# Patient Record
Sex: Male | Born: 1965 | Race: White | Hispanic: No | Marital: Married | Smoking: Former smoker
Health system: Southern US, Community
[De-identification: ages and names within clinical notes are randomized; demographics above are authoritative.]

## PROBLEM LIST (undated history)

## (undated) HISTORY — PX: HERNIA REPAIR: SHX51

## (undated) HISTORY — PX: KNEE SURGERY: SHX244

## (undated) HISTORY — PX: BACK SURGERY: SHX140

---

## 1999-07-23 ENCOUNTER — Encounter: Payer: Self-pay | Admitting: Neurosurgery

## 1999-07-23 ENCOUNTER — Ambulatory Visit (HOSPITAL_COMMUNITY): Admission: RE | Admit: 1999-07-23 | Discharge: 1999-07-23 | Payer: Self-pay | Admitting: Neurosurgery

## 1999-07-25 ENCOUNTER — Inpatient Hospital Stay (HOSPITAL_COMMUNITY): Admission: RE | Admit: 1999-07-25 | Discharge: 1999-07-26 | Payer: Self-pay | Admitting: Neurosurgery

## 1999-07-25 ENCOUNTER — Encounter: Payer: Self-pay | Admitting: Neurosurgery

## 2004-09-13 ENCOUNTER — Ambulatory Visit: Payer: Self-pay | Admitting: Internal Medicine

## 2004-09-27 ENCOUNTER — Ambulatory Visit: Payer: Self-pay | Admitting: Internal Medicine

## 2005-02-07 ENCOUNTER — Observation Stay: Payer: Self-pay | Admitting: Internal Medicine

## 2005-02-07 ENCOUNTER — Other Ambulatory Visit: Payer: Self-pay

## 2005-11-20 ENCOUNTER — Emergency Department: Payer: Self-pay | Admitting: Emergency Medicine

## 2005-11-20 ENCOUNTER — Other Ambulatory Visit: Payer: Self-pay

## 2006-08-01 ENCOUNTER — Emergency Department: Payer: Self-pay | Admitting: Emergency Medicine

## 2006-11-30 ENCOUNTER — Ambulatory Visit: Payer: Self-pay | Admitting: Internal Medicine

## 2008-07-06 ENCOUNTER — Ambulatory Visit: Payer: Self-pay | Admitting: Internal Medicine

## 2011-02-09 ENCOUNTER — Ambulatory Visit: Payer: Self-pay | Admitting: Family Medicine

## 2011-05-10 ENCOUNTER — Ambulatory Visit: Payer: Self-pay | Admitting: Internal Medicine

## 2014-04-22 ENCOUNTER — Emergency Department: Payer: Self-pay | Admitting: Emergency Medicine

## 2014-04-22 LAB — URINALYSIS, COMPLETE
Bacteria: NONE SEEN
Bilirubin,UR: NEGATIVE
Glucose,UR: NEGATIVE mg/dL (ref 0–75)
Ketone: NEGATIVE
Leukocyte Esterase: NEGATIVE
Nitrite: NEGATIVE
Ph: 5 (ref 4.5–8.0)
Protein: 30
RBC,UR: 1880 /HPF (ref 0–5)
Specific Gravity: 1.028 (ref 1.003–1.030)
Squamous Epithelial: NONE SEEN
WBC UR: 7 /HPF (ref 0–5)

## 2014-04-22 LAB — COMPREHENSIVE METABOLIC PANEL
Albumin: 4.1 g/dL (ref 3.4–5.0)
Alkaline Phosphatase: 100 U/L
Anion Gap: 7 (ref 7–16)
BUN: 23 mg/dL — ABNORMAL HIGH (ref 7–18)
Bilirubin,Total: 0.7 mg/dL (ref 0.2–1.0)
Calcium, Total: 9.3 mg/dL (ref 8.5–10.1)
Chloride: 104 mmol/L (ref 98–107)
Co2: 28 mmol/L (ref 21–32)
Creatinine: 1.29 mg/dL (ref 0.60–1.30)
EGFR (African American): >60
EGFR (Non-African Amer.): >60
Glucose: 98 mg/dL (ref 65–99)
Osmolality: 281 (ref 275–301)
Potassium: 3.8 mmol/L (ref 3.5–5.1)
SGOT(AST): 26 U/L (ref 15–37)
SGPT (ALT): 32 U/L (ref 12–78)
Sodium: 139 mmol/L (ref 136–145)
Total Protein: 7.8 g/dL (ref 6.4–8.2)

## 2014-04-22 LAB — CBC
HCT: 52.8 % — ABNORMAL HIGH (ref 40.0–52.0)
HGB: 17.6 g/dL (ref 13.0–18.0)
MCH: 32.4 pg (ref 26.0–34.0)
MCHC: 33.4 g/dL (ref 32.0–36.0)
MCV: 97 fL (ref 80–100)
Platelet: 146 10*3/uL — ABNORMAL LOW (ref 150–440)
RBC: 5.44 10*6/uL (ref 4.40–5.90)
RDW: 12.5 % (ref 11.5–14.5)
WBC: 11.9 10*3/uL — ABNORMAL HIGH (ref 3.8–10.6)

## 2014-05-11 DIAGNOSIS — N200 Calculus of kidney: Secondary | ICD-10-CM | POA: Insufficient documentation

## 2015-02-11 DIAGNOSIS — M5136 Other intervertebral disc degeneration, lumbar region: Secondary | ICD-10-CM | POA: Insufficient documentation

## 2015-06-27 DIAGNOSIS — Z87442 Personal history of urinary calculi: Secondary | ICD-10-CM | POA: Insufficient documentation

## 2017-03-02 ENCOUNTER — Ambulatory Visit
Admission: EM | Admit: 2017-03-02 | Discharge: 2017-03-02 | Disposition: A | Discharge status: Home / Self Care | Payer: BLUE CROSS/BLUE SHIELD | Attending: Internal Medicine | Admitting: Internal Medicine

## 2017-03-02 DIAGNOSIS — J069 Acute upper respiratory infection, unspecified: Secondary | ICD-10-CM

## 2017-03-02 DIAGNOSIS — J029 Acute pharyngitis, unspecified: Secondary | ICD-10-CM

## 2017-03-02 LAB — RAPID STREP SCREEN (MED CTR MEBANE ONLY): STREPTOCOCCUS, GROUP A SCREEN (DIRECT): NEGATIVE

## 2017-03-02 MED ORDER — AMOXICILLIN-POT CLAVULANATE 875-125 MG PO TABS: 1.0000 | ORAL_TABLET | Freq: Two times a day (BID) | ORAL | 0 refills | Status: DC

## 2017-03-02 NOTE — ED Triage Notes (Signed)
Patient complains of sore throat, painful swallowing, redness and swelling with possible low grade fever. Symptoms started 1 week ago and have been worsening.

## 2017-03-02 NOTE — ED Provider Notes (Signed)
CSN: [id number]     Arrival date & time 03/02/17  1814 History   First MD Initiated Contact with Patient 03/02/17 1838     Chief Complaint  Patient presents with  . Sore Throat   (Consider location/radiation/quality/duration/timing/severity/associated sxs/prior Treatment) HPI  This a 51 year old male who presents with a one-week history of sore throat painful swallowing fever cough and congestion with a runny nose. He states that it is worsening over time. He does not smoke but used to smoke. He is afebrile today. O2 sats on room air 98%.      History reviewed. No pertinent past medical history. Past Surgical History:  Procedure Laterality Date  . BACK SURGERY    . HERNIA REPAIR    . KNEE SURGERY Right    Family History  Problem Relation Age of Onset  . CAD Mother   . Kidney Stones Father    Social History  Substance Use Topics  . Smoking status: Former Research scientist (life sciences)  . Smokeless tobacco: Never Used  . Alcohol use Yes     Comment: occasionally    Review of Systems  Constitutional: Positive for activity change. Negative for chills, fatigue and fever.  HENT: Positive for congestion, postnasal drip, rhinorrhea, sore throat and trouble swallowing.   Respiratory: Positive for cough. Negative for shortness of breath, wheezing and stridor.   All other systems reviewed and are negative.   Allergies  Patient has no known allergies.  Home Medications   Prior to Admission medications   Medication Sig Start Date End Date Taking? Authorizing Provider  amoxicillin-clavulanate (AUGMENTIN) 875-125 MG tablet Take 1 tablet by mouth every 12 (twelve) hours. 03/02/17   Lorin Picket, PA-C   Meds Ordered and Administered this Visit  Medications - No data to display  BP (!) 149/97 (BP Location: Left Arm)   Pulse 96   Temp 97.9 F (36.6 C) (Oral)   Resp 18   Ht 5\' 11"  (1.803 m)   Wt 225 lb (102.1 kg)   SpO2 98%   BMI 31.38 kg/m  No data found.   Physical Exam  Constitutional:  He is oriented to person, place, and time. He appears well-developed and well-nourished. No distress.  HENT:  Head: Normocephalic.  Right Ear: External ear normal.  Left Ear: External ear normal.  Nose: Nose normal.  Mouth/Throat: No oropharyngeal exudate.  Pharynx is erythematous but without exudate.  Eyes: Pupils are equal, round, and reactive to light. Right eye exhibits no discharge. Left eye exhibits no discharge.  Neck: Normal range of motion.  Pulmonary/Chest: Effort normal and breath sounds normal. No respiratory distress. He has no wheezes. He has no rales.  Musculoskeletal: Normal range of motion.  Lymphadenopathy:    He has no cervical adenopathy.  Neurological: He is alert and oriented to person, place, and time.  Skin: Skin is warm and dry. He is not diaphoretic.  Psychiatric: He has a normal mood and affect. His behavior is normal. Judgment and thought content normal.  Nursing note and vitals reviewed.   Urgent Care Course     Procedures (including critical care time)  Labs Review Labs Reviewed  RAPID STREP SCREEN (NOT AT [facility])  CULTURE, GROUP A STREP [facility])    Imaging Review No results found.   Visual Acuity Review  Right Eye Distance:   Left Eye Distance:   Bilateral Distance:    Right Eye Near:   Left Eye Near:    Bilateral Near:  MDM   1. Pharyngitis, unspecified etiology   2. Upper respiratory tract infection, unspecified type    Discharge Medication List as of 03/02/2017  6:59 PM    START taking these medications   Details  amoxicillin-clavulanate (AUGMENTIN) 875-125 MG tablet Take 1 tablet by mouth every 12 (twelve) hours., Starting Fri 03/02/2017, Normal      Plan: 1. Test/x-ray results and diagnosis reviewed with patient 2. rx as per orders; risks, benefits, potential side effects reviewed with patient 3. Recommend supportive treatment with fluids and rest. He'll use saltwater gargles and lozenges as necessary for comfort of  his throat. We'll start the patient on empiric Augmentin since he's had this for week has not improved and his throat is rather sore despite the rapid test. The culture sensitivities will be available in 48 hours. 4. F/u prn if symptoms worsen or don't improve     Lorin Picket, PA-C 03/02/17 1916

## 2017-03-04 LAB — CULTURE, GROUP A STREP (THRC)

## 2017-11-03 ENCOUNTER — Emergency Department: Payer: BLUE CROSS/BLUE SHIELD

## 2017-11-03 ENCOUNTER — Encounter: Payer: Self-pay | Admitting: Intensive Care

## 2017-11-03 ENCOUNTER — Emergency Department
Admission: EM | Admit: 2017-11-03 | Discharge: 2017-11-03 | Disposition: A | Discharge status: Home / Self Care | Payer: BLUE CROSS/BLUE SHIELD | Attending: Student in an Organized Health Care Education/Training Program | Admitting: Student in an Organized Health Care Education/Training Program

## 2017-11-03 DIAGNOSIS — R1013 Epigastric pain: Secondary | ICD-10-CM | POA: Diagnosis present

## 2017-11-03 DIAGNOSIS — F17210 Nicotine dependence, cigarettes, uncomplicated: Secondary | ICD-10-CM | POA: Diagnosis not present

## 2017-11-03 LAB — TROPONIN I: Troponin I: <0.03 ng/mL (ref <0.03)

## 2017-11-03 LAB — COMPREHENSIVE METABOLIC PANEL
ALBUMIN: 4.5 g/dL (ref 3.5–5.0)
ALT: 27 U/L (ref 17–63)
ANION GAP: 9 (ref 5–15)
AST: 29 U/L (ref 15–41)
Alkaline Phosphatase: 97 U/L (ref 38–126)
BILIRUBIN TOTAL: 1.1 mg/dL (ref 0.3–1.2)
BUN: 17 mg/dL (ref 6–20)
CALCIUM: 9.3 mg/dL (ref 8.9–10.3)
CO2: 24 mmol/L (ref 22–32)
Chloride: 106 mmol/L (ref 101–111)
Creatinine, Ser: 0.99 mg/dL (ref 0.61–1.24)
GFR calc Af Amer: >60 mL/min (ref >60)
GFR calc non Af Amer: >60 mL/min (ref >60)
GLUCOSE: 110 mg/dL — AB (ref 65–99)
Potassium: 4 mmol/L (ref 3.5–5.1)
Sodium: 139 mmol/L (ref 135–145)
TOTAL PROTEIN: 7.9 g/dL (ref 6.5–8.1)

## 2017-11-03 LAB — CBC WITH DIFFERENTIAL/PLATELET
BASOS PCT: 0 %
Basophils Absolute: 0 10*3/uL (ref 0–0.1)
Eosinophils Absolute: 0.3 10*3/uL (ref 0–0.7)
Eosinophils Relative: 4 %
HEMATOCRIT: 51.8 % (ref 40.0–52.0)
HEMOGLOBIN: 17.9 g/dL (ref 13.0–18.0)
Lymphocytes Relative: 23 %
Lymphs Abs: 1.6 10*3/uL (ref 1.0–3.6)
MCH: 32.4 pg (ref 26.0–34.0)
MCHC: 34.6 g/dL (ref 32.0–36.0)
MCV: 93.8 fL (ref 80.0–100.0)
MONOS PCT: 7 %
Monocytes Absolute: 0.5 10*3/uL (ref 0.2–1.0)
NEUTROS ABS: 4.7 10*3/uL (ref 1.4–6.5)
NEUTROS PCT: 66 %
Platelets: 140 10*3/uL — ABNORMAL LOW (ref 150–440)
RBC: 5.52 MIL/uL (ref 4.40–5.90)
RDW: 12.5 % (ref 11.5–14.5)
WBC: 7.1 10*3/uL (ref 3.8–10.6)

## 2017-11-03 LAB — LIPASE, BLOOD: Lipase: 42 U/L (ref 11–51)

## 2017-11-03 MED ORDER — GI COCKTAIL ~~LOC~~
30.0000 mL | Freq: Once | ORAL | Status: AC
  Administered 2017-11-03: 30 mL via ORAL
  Filled 2017-11-03: qty 30

## 2017-11-03 MED ORDER — RANITIDINE HCL 150 MG PO TABS: 150.0000 mg | ORAL_TABLET | Freq: Two times a day (BID) | ORAL | 1 refills | Status: DC

## 2017-11-03 NOTE — ED Provider Notes (Signed)
[facility] Emergency Department Provider Note    First MD Initiated Contact with Patient 11/03/17 [phone]     (approximate)  I have reviewed the triage vital signs and the nursing notes.   HISTORY  Chief Complaint Abdominal Pain    HPI Alan Santos is a 52 y.o. male presents with chief complaint of several weeks of midepigastric burning discomfort unchanged by eating and no fevers or nausea.  He is moving his bowels.  States that he is concerned is related to his gallbladder is his wife just had her gallbladder removed and he thinks his similar symptoms that she was having.  Denies any chest pain or shortness of breath.  Does have a history of reflux.  He does smoke and drink alcohol.  No pain radiating through to his back.  Came to the ER today because the pain and not improved over the past several weeks and wanted to figure out what was going on.  History reviewed. No pertinent past medical history. Family History  Problem Relation Age of Onset  . CAD Mother   . Kidney Stones Father    Past Surgical History:  Procedure Laterality Date  . BACK SURGERY    . HERNIA REPAIR    . KNEE SURGERY Right    There are no active problems to display for this patient.     Prior to Admission medications   Medication Sig Start Date End Date Taking? Authorizing Provider  amoxicillin-clavulanate (AUGMENTIN) 875-125 MG tablet Take 1 tablet by mouth every 12 (twelve) hours. 03/02/17   Lorin Picket, PA-C  ranitidine (ZANTAC) 150 MG tablet Take 1 tablet (150 mg total) by mouth 2 (two) times daily. 11/03/17 11/03/18  Merlyn Lot, MD    Allergies Patient has no known allergies.    Social History Social History   Tobacco Use  . Smoking status: Current Every Day Smoker    Types: Cigarettes  . Smokeless tobacco: Never Used  Substance Use Topics  . Alcohol use: Yes    Comment: occasionally  . Drug use: No    Review of Systems Patient denies  headaches, rhinorrhea, blurry vision, numbness, shortness of breath, chest pain, edema, cough, abdominal pain, nausea, vomiting, diarrhea, dysuria, fevers, rashes or hallucinations unless otherwise stated above in HPI. ____________________________________________   PHYSICAL EXAM:  VITAL SIGNS: Vitals:   11/03/17 0758 11/03/17 0817  BP: (!) 184/105 (!) 128/98  Pulse: (!) 107   Resp: 18   Temp: 98 F (36.7 C) 98.6 F (37 C)  SpO2: 97%     Constitutional: Alert and oriented. Well appearing and in no acute distress. Eyes: Conjunctivae are normal.  Head: Atraumatic. Nose: No congestion/rhinnorhea. Mouth/Throat: Mucous membranes are moist.   Neck: No stridor. Painless ROM.  Cardiovascular: Normal rate, regular rhythm. Grossly normal heart sounds.  Good peripheral circulation. Respiratory: Normal respiratory effort.  No retractions. Lungs CTAB. Gastrointestinal: Soft and nontender. No distention. No abdominal bruits. No CVA tenderness. Genitourinary:  Musculoskeletal: No lower extremity tenderness nor edema.  No joint effusions. Neurologic:  Normal speech and language. No gross focal neurologic deficits are appreciated. No facial droop Skin:  Skin is warm, dry and intact. No rash noted. Psychiatric: Mood and affect are normal. Speech and behavior are normal.  ____________________________________________   LABS (all labs ordered are listed, but only abnormal results are displayed)  Results for orders placed or performed during the hospital encounter of 11/03/17 (from the past 24 hour(s))  CBC with Differential/Platelet  Status: Abnormal   Collection Time: 11/03/17  8:06 AM  Result Value Ref Range   WBC 7.1 3.8 - 10.6 K/uL   RBC 5.52 4.40 - 5.90 MIL/uL   Hemoglobin 17.9 13.0 - 18.0 g/dL   HCT 51.8 40.0 - 52.0 %   MCV 93.8 80.0 - 100.0 fL   MCH 32.4 26.0 - 34.0 pg   MCHC 34.6 32.0 - 36.0 g/dL   RDW 12.5 11.5 - 14.5 %   Platelets 140 (L) 150 - 440 K/uL   Neutrophils  Relative % 66 %   Neutro Abs 4.7 1.4 - 6.5 K/uL   Lymphocytes Relative 23 %   Lymphs Abs 1.6 1.0 - 3.6 K/uL   Monocytes Relative 7 %   Monocytes Absolute 0.5 0.2 - 1.0 K/uL   Eosinophils Relative 4 %   Eosinophils Absolute 0.3 0 - 0.7 K/uL   Basophils Relative 0 %   Basophils Absolute 0.0 0 - 0.1 K/uL  Comprehensive metabolic panel     Status: Abnormal   Collection Time: 11/03/17  8:06 AM  Result Value Ref Range   Sodium 139 135 - 145 mmol/L   Potassium 4.0 3.5 - 5.1 mmol/L   Chloride 106 101 - 111 mmol/L   CO2 24 22 - 32 mmol/L   Glucose, Bld 110 (H) 65 - 99 mg/dL   BUN 17 6 - 20 mg/dL   Creatinine, Ser 0.99 0.61 - 1.24 mg/dL   Calcium 9.3 8.9 - 10.3 mg/dL   Total Protein 7.9 6.5 - 8.1 g/dL   Albumin 4.5 3.5 - 5.0 g/dL   AST 29 15 - 41 U/L   ALT 27 17 - 63 U/L   Alkaline Phosphatase 97 38 - 126 U/L   Total Bilirubin 1.1 0.3 - 1.2 mg/dL   GFR calc non Af Amer >60 >60 mL/min   GFR calc Af Amer >60 >60 mL/min   Anion gap 9 5 - 15  Lipase, blood     Status: None   Collection Time: 11/03/17  8:06 AM  Result Value Ref Range   Lipase 42 11 - 51 U/L  Troponin I     Status: None   Collection Time: 11/03/17  8:06 AM  Result Value Ref Range   Troponin I <0.03 <0.03 ng/mL   ____________________________________________  EKG My review and personal interpretation at Time: 8:02   Indication: abdominal pain  Rate: 100  Rhythm: sinus Axis: normal Other: nonspecific st changes, no stemi ____________________________________________  RADIOLOGY   ____________________________________________   PROCEDURES  Procedure(s) performed:  Procedures    Critical Care performed: no ____________________________________________   INITIAL IMPRESSION / ASSESSMENT AND PLAN / ED COURSE  Pertinent labs & imaging results that were available during my care of the patient were reviewed by me and considered in my medical decision making (see chart for details).  DDX: Gastritis, gastric  outlet obstruction, esophagitis, cholecystitis, cholelithiasis, biliary colic, IBS, IBD, ACS, SBO  Cashawn Yanko Santos is a 52 y.o. who presents to the ED with several weeks of epigastric discomfort as described above.  Patient is well-appearing and in no acute distress.  Vital signs are stable.  Blood work sent for the above differential is reassuring.  This not clinically consistent with acute cholelithiasis or cholecystitis.  There is no evidence of biliary elevation.  No evidence of acute appendicitis.  This does seem most consistent with gastritis possible esophagitis given his smoking history and alcohol history.  No evidence of pancreatitis.  At this point will  give patient prescription for antacid medication and referral to outpatient GI.  Have discussed with the patient and available family all diagnostics and treatments performed thus far and all questions were answered to the best of my ability. The patient demonstrates understanding and agreement with plan.       ____________________________________________   FINAL CLINICAL IMPRESSION(S) / ED DIAGNOSES  Final diagnoses:  Epigastric pain      NEW MEDICATIONS STARTED DURING THIS VISIT:  New Prescriptions   RANITIDINE (ZANTAC) 150 MG TABLET    Take 1 tablet (150 mg total) by mouth 2 (two) times daily.     Note:  This document was prepared using Dragon voice recognition software and may include unintentional dictation errors.    Merlyn Lot, MD 11/03/17 [phone]

## 2017-11-03 NOTE — Discharge Instructions (Signed)

## 2017-11-03 NOTE — ED Triage Notes (Signed)
Patient c/o epigastric constant pain for weeks. C/o nausea and diarrhea. No distress during ambulation in triage

## 2017-11-03 NOTE — Progress Notes (Signed)
C/o Epigastric pain on and off for two weeks. Dull non radiating associated with occasional diarrhea (non bloody) , no nausea, no vomiting. Denies fevers, change in diet, SOB or cough. No know relieving or aggravating factors . Smokes cigarettes, occasional alcohol drinker . No previous history of the same .  DDX.  - Gastritis - Gastric ulcer - Pancreatitis - GERD

## 2017-11-05 ENCOUNTER — Telehealth: Payer: Self-pay | Admitting: Gastroenterology

## 2017-11-05 NOTE — Telephone Encounter (Signed)
PATIENT CALLED & L/M ON THE ANSWERING MACHINE STATING HE WAS SEEN IN THE ER & NEEDED TO SCHEDULE AN APPOINTMENT.I CALLED HIM & L/M TO RETURN CALL TO SCHEDULE APPOINTMENT WITH DR Marius Ditch F/U FROM ER(PER ER)

## 2017-11-05 NOTE — Telephone Encounter (Signed)
RETURNED PATIENT'S CALL TO HOME # & L/M ASKING HIM TO CALL BACK & SCHEDULE AN APPOINTMENT WITH DR VANGA.(ER REF). PATIENT WANTED AN APPOITMENT AROUND 3 BUT ASAP. HE WAS OFFERED 1 AVAIL AT 11-09-17 @ 10:45 OR 11-20-17 @ 2:45. STATED THIS IS WHAT WE HAD CURRENTLY,MAY BE DIFFERENT WHEN HE CALLED BACK.

## 2017-11-09 ENCOUNTER — Encounter: Payer: Self-pay | Admitting: Gastroenterology

## 2017-11-09 ENCOUNTER — Ambulatory Visit (INDEPENDENT_AMBULATORY_CARE_PROVIDER_SITE_OTHER): Payer: BLUE CROSS/BLUE SHIELD | Admitting: Gastroenterology

## 2017-11-09 ENCOUNTER — Other Ambulatory Visit: Payer: Self-pay

## 2017-11-09 VITALS — BP 160/115 | HR 63 | Temp 98.2°F | Ht 71.0 in | Wt 231.0 lb

## 2017-11-09 DIAGNOSIS — R1012 Left upper quadrant pain: Secondary | ICD-10-CM | POA: Diagnosis not present

## 2017-11-09 DIAGNOSIS — R1013 Epigastric pain: Secondary | ICD-10-CM

## 2017-11-09 MED ORDER — POLYETHYLENE GLYCOL 3350 17 GM/SCOOP PO POWD: 1.0000 | Freq: Two times a day (BID) | ORAL | 0 refills | Status: DC

## 2017-11-09 NOTE — Progress Notes (Signed)
Cephas Darby, MD [address]  [place]  [place], [place] [address]  Main: [phone]  Fax: [phone]    Gastroenterology Consultation  Referring Provider:     No ref. provider found Primary Care Physician:  Rusty Aus, MD Primary Gastroenterologist:  Dr. Cephas Darby Reason for Consultation:     Acute upper abdominal pain        HPI:   Alan Santos is a 52 y.o. male referred by Dr. Sabra Heck, Christean Grief, MD  for consultation & management of acute upper abdominal pain. He went to ER on 11/03/2017 secondary to sudden onset of upper abdominal sharp pain in the epigastric/substernal region radiating to right upper quadrant. Pain has been constant with no particular relation to food. He had similar episodes in the past, underwent ultrasound, HIDA scan in 2009, CT A/P in 2012 which revealed fatty liver, mild splenomegaly as well as retained stool in colon. He had CBC which revealed mild thrombocytopenia, normal CMP, lipase and troponin which were unremarkable in the ER. He is referred from ER for further evaluation. Patient consumes red meat most of the days in a week, he smokes for several years, stopped smoking about a week ago. He consumes alcohol over the weekends. He denies weight loss, loss of appetite, fever, chills, nausea, vomiting. He has noticed that his bowel movements have been irregular associated with hard stools. He also reports that he has been dealing with symptomatic hemorrhoids for a long time. He is prescribed ranitidine 150 mg twice daily from the ER.  NSAIDs: Occasional  Antiplts/Anticoagulants/Anti thrombotics: None  GI Procedures: He never had a colonoscopy Denies having had an EGD Denies family history of GI malignancy No prior abdominal surgeries  History reviewed. No pertinent past medical history.  Past Surgical History:  Procedure Laterality Date  . BACK SURGERY    . HERNIA REPAIR    . KNEE SURGERY Right     Prior to Admission  medications   Medication Sig Start Date End Date Taking? Authorizing Provider  ranitidine (ZANTAC) 150 MG tablet Take 1 tablet (150 mg total) by mouth 2 (two) times daily. 11/03/17 11/03/18 Yes Merlyn Lot, MD    Family History  Problem Relation Age of Onset  . CAD Mother   . Kidney Stones Father      Social History   Tobacco Use  . Smoking status: Current Every Day Smoker    Types: Cigarettes  . Smokeless tobacco: Never Used  Substance Use Topics  . Alcohol use: Yes    Comment: occasionally  . Drug use: No    Allergies as of 11/09/2017  . (No Known Allergies)    Review of Systems:    All systems reviewed and negative except where noted in HPI.   Physical Exam:  BP (!) 160/115   Pulse 63   Temp 98.2 F (36.8 C) (Oral)   Ht 5\' 11"  (1.803 m)   Wt 231 lb (104.8 kg)   BMI 32.22 kg/m  No LMP for male patient.  General:   Alert,  Well-developed, well-nourished, pleasant and cooperative in NAD Head:  Normocephalic and atraumatic. Eyes:  Sclera clear, no icterus.   Conjunctiva pink. Ears:  Normal auditory acuity. Nose:  No deformity, discharge, or lesions. Mouth:  No deformity or lesions,oropharynx pink & moist. Neck:  Supple; no masses or thyromegaly. Lungs:  Respirations even and unlabored.  Clear throughout to auscultation.   No wheezes, crackles, or rhonchi. No acute distress. Heart:  Regular rate and rhythm; no murmurs, clicks, rubs, or gallops. Abdomen:  Normal bowel sounds. Soft, mild epigastric tenderness and non-distended without masses, mild splenomegaly, no hepatomegaly or hernias noted.  No guarding or rebound tenderness.   Rectal: Not performed Msk:  Symmetrical without gross deformities. Good, equal movement & strength bilaterally. Pulses:  Normal pulses noted. Extremities:  No clubbing or edema.  No cyanosis. Neurologic:  Alert and oriented x3;  grossly normal neurologically. Skin:  Intact without significant lesions or rashes. No jaundice. Lymph  Nodes:  No significant cervical adenopathy. Psych:  Alert and cooperative. Normal mood and affect.  Imaging Studies: Reviewed both ultrasound and CT  Assessment and Plan:   Alan Santos is a 52 y.o. male with no significant past medical history presents with recurrent episodes of upper abdominal pain, he has history of fatty liver, splenomegaly with thrombocytopenia. I'm suspecting he may have Nash fibrosis/cirrhosis based on splenomegaly and thrombocytopenia as well as fatty liver.  - Recommend CT A/P with IV contrast to evaluate for pancreas and spleen as well as liver - Recommend EGD and colonoscopy after the CT scan - Highly encouraged him to avoid red meat - Add fiber and MiraLAX to regulate his bowel movements   Follow up in 3-4 weeks after the above workup   Cephas Darby, MD

## 2017-11-12 ENCOUNTER — Telehealth: Payer: Self-pay | Admitting: Gastroenterology

## 2017-11-12 NOTE — Telephone Encounter (Signed)
He is calling for his CT results done in [place] over the weekend.Please call him at [phone].

## 2017-11-13 ENCOUNTER — Telehealth: Payer: Self-pay | Admitting: Gastroenterology

## 2017-11-13 ENCOUNTER — Other Ambulatory Visit: Payer: Self-pay

## 2017-11-13 DIAGNOSIS — R1013 Epigastric pain: Secondary | ICD-10-CM

## 2017-11-13 MED ORDER — POLYETHYLENE GLYCOL 3350 17 GM/SCOOP PO POWD: 17.0000 g | Freq: Two times a day (BID) | ORAL | 0 refills | Status: AC

## 2017-11-13 NOTE — Telephone Encounter (Signed)
Please call patient about CT scan appt.

## 2017-11-14 ENCOUNTER — Other Ambulatory Visit: Payer: Self-pay

## 2017-11-14 DIAGNOSIS — K760 Fatty (change of) liver, not elsewhere classified: Secondary | ICD-10-CM

## 2017-11-14 DIAGNOSIS — R1010 Upper abdominal pain, unspecified: Secondary | ICD-10-CM

## 2017-11-14 NOTE — Telephone Encounter (Signed)
Spoke with patient.  Has not had CT done.  Order reentered and scheduled with central scheduling for 2/15 @ 4pm.  Lmom for patient to go by and get prep and details on Test procedure//kdc

## 2017-11-14 NOTE — Telephone Encounter (Signed)
lmom to call back 

## 2017-11-23 ENCOUNTER — Other Ambulatory Visit: Payer: Self-pay

## 2017-11-23 ENCOUNTER — Ambulatory Visit
Admission: RE | Admit: 2017-11-23 | Discharge: 2017-11-23 | Disposition: A | Discharge status: Home / Self Care | Payer: BLUE CROSS/BLUE SHIELD | Source: Ambulatory Visit | Attending: Gastroenterology | Admitting: Gastroenterology

## 2017-11-23 ENCOUNTER — Ambulatory Visit: Admission: RE | Admit: 2017-11-23 | Payer: BLUE CROSS/BLUE SHIELD | Source: Ambulatory Visit

## 2017-11-23 DIAGNOSIS — M899 Disorder of bone, unspecified: Secondary | ICD-10-CM | POA: Diagnosis not present

## 2017-11-23 DIAGNOSIS — R911 Solitary pulmonary nodule: Secondary | ICD-10-CM | POA: Insufficient documentation

## 2017-11-23 DIAGNOSIS — K760 Fatty (change of) liver, not elsewhere classified: Secondary | ICD-10-CM | POA: Diagnosis not present

## 2017-11-23 DIAGNOSIS — R1010 Upper abdominal pain, unspecified: Secondary | ICD-10-CM | POA: Diagnosis present

## 2017-11-23 DIAGNOSIS — K8020 Calculus of gallbladder without cholecystitis without obstruction: Secondary | ICD-10-CM | POA: Insufficient documentation

## 2017-11-23 MED ORDER — IOPAMIDOL (ISOVUE-300) INJECTION 61%
100.0000 mL | Freq: Once | INTRAVENOUS | Status: AC | PRN
  Administered 2017-11-23: 100 mL via INTRAVENOUS

## 2017-11-26 ENCOUNTER — Ambulatory Visit
Admission: EM | Admit: 2017-11-26 | Discharge: 2017-11-26 | Disposition: A | Discharge status: Home / Self Care | Payer: BLUE CROSS/BLUE SHIELD | Attending: Family Medicine | Admitting: Family Medicine

## 2017-11-26 DIAGNOSIS — R05 Cough: Secondary | ICD-10-CM | POA: Diagnosis not present

## 2017-11-26 DIAGNOSIS — R062 Wheezing: Secondary | ICD-10-CM | POA: Diagnosis not present

## 2017-11-26 DIAGNOSIS — R059 Cough, unspecified: Secondary | ICD-10-CM

## 2017-11-26 DIAGNOSIS — J40 Bronchitis, not specified as acute or chronic: Secondary | ICD-10-CM

## 2017-11-26 MED ORDER — PREDNISONE 20 MG PO TABS: 20.0000 mg | ORAL_TABLET | Freq: Every day | ORAL | 0 refills | Status: DC

## 2017-11-26 MED ORDER — DOXYCYCLINE HYCLATE 100 MG PO TABS: 100.0000 mg | ORAL_TABLET | Freq: Two times a day (BID) | ORAL | 0 refills | Status: DC

## 2017-11-26 MED ORDER — ALBUTEROL SULFATE HFA 108 (90 BASE) MCG/ACT IN AERS: 1.0000 | INHALATION_SPRAY | Freq: Four times a day (QID) | RESPIRATORY_TRACT | 0 refills | Status: AC | PRN

## 2017-11-26 NOTE — ED Provider Notes (Signed)
[facility]    CSN: [id number] Arrival date & time: 11/26/17  1515     History   Chief Complaint Chief Complaint  Patient presents with  . Cough    HPI Alan Santos is a 52 y.o. male.   The history is provided by the patient.  Cough  Associated symptoms: wheezing   URI  Presenting symptoms: congestion and cough   Severity:  Moderate Onset quality:  Sudden Duration:  1 week Timing:  Constant Progression:  Worsening Chronicity:  New Relieved by:  Nothing Ineffective treatments:  OTC medications Associated symptoms: wheezing   Risk factors: sick contacts   Risk factors: not elderly, no chronic cardiac disease, no chronic kidney disease, no diabetes mellitus, no immunosuppression, no recent illness and no recent travel  Chronic respiratory disease: unknown, however chronic smoker.     History reviewed. No pertinent past medical history.  Patient Active Problem List   Diagnosis Date Noted  . History of urinary stone 06/27/2015  . Disc degeneration, lumbar 02/11/2015  . Kidney stone 05/11/2014    Past Surgical History:  Procedure Laterality Date  . BACK SURGERY    . HERNIA REPAIR    . KNEE SURGERY Right        Home Medications    Prior to Admission medications   Medication Sig Start Date End Date Taking? Authorizing Provider  ranitidine (ZANTAC) 150 MG tablet Take 1 tablet (150 mg total) by mouth 2 (two) times daily. 11/03/17 11/03/18 Yes Merlyn Lot, MD  albuterol (PROVENTIL HFA;VENTOLIN HFA) 108 (90 Base) MCG/ACT inhaler Inhale 1-2 puffs into the lungs every 6 (six) hours as needed for wheezing or shortness of breath. 11/26/17   Norval Gable, MD  doxycycline (VIBRA-TABS) 100 MG tablet Take 1 tablet (100 mg total) by mouth 2 (two) times daily. 11/26/17   Norval Gable, MD  predniSONE (DELTASONE) 20 MG tablet Take 1 tablet (20 mg total) by mouth daily. 11/26/17   Norval Gable, MD    Family History Family History  Problem Relation  Age of Onset  . CAD Mother   . Kidney Stones Father     Social History Social History   Tobacco Use  . Smoking status: Former Smoker    Types: Cigarettes    Last attempt to quit: 10/26/2017    Years since quitting: 0.0  . Smokeless tobacco: Never Used  Substance Use Topics  . Alcohol use: Yes    Comment: occasionally  . Drug use: No     Allergies   Patient has no known allergies.   Review of Systems Review of Systems  HENT: Positive for congestion.   Respiratory: Positive for cough and wheezing.      Physical Exam Triage Vital Signs ED Triage Vitals  Enc Vitals Group     BP 11/26/17 1526 (!) 171/99     Pulse Rate 11/26/17 1526 85     Resp 11/26/17 1526 18     Temp 11/26/17 1526 97.9 F (36.6 C)     Temp Source 11/26/17 1526 Oral     SpO2 11/26/17 1526 98 %     Weight --      Height --      Head Circumference --      Peak Flow --      Pain Score 11/26/17 1529 0     Pain Loc --      Pain Edu? --      Excl. in [place]? --    No data found.  Updated Vital Signs BP (!) 173/99   Pulse 85   Temp 97.9 F (36.6 C) (Oral)   Resp 18   SpO2 98%   Visual Acuity Right Eye Distance:   Left Eye Distance:   Bilateral Distance:    Right Eye Near:   Left Eye Near:    Bilateral Near:     Physical Exam  Constitutional: He appears well-developed and well-nourished. No distress.  HENT:  Head: Normocephalic and atraumatic.  Right Ear: Tympanic membrane, external ear and ear canal normal.  Left Ear: Tympanic membrane, external ear and ear canal normal.  Nose: Nose normal.  Mouth/Throat: Uvula is midline, oropharynx is clear and moist and mucous membranes are normal. No oropharyngeal exudate or tonsillar abscesses.  Eyes: Conjunctivae are normal. Right eye exhibits no discharge. Left eye exhibits no discharge. No scleral icterus.  Neck: Normal range of motion. Neck supple. No tracheal deviation present. No thyromegaly present.  Cardiovascular: Normal rate, regular  rhythm and normal heart sounds.  Pulmonary/Chest: Effort normal. No stridor. No respiratory distress. He has wheezes (and diffuse rhonchi). He has no rales. He exhibits no tenderness.  Lymphadenopathy:    He has no cervical adenopathy.  Neurological: He is alert.  Skin: Skin is warm and dry. No rash noted. He is not diaphoretic.  Nursing note and vitals reviewed.    UC Treatments / Results  Labs (all labs ordered are listed, but only abnormal results are displayed) Labs Reviewed - No data to display  EKG  EKG Interpretation None       Radiology No results found.  Procedures Procedures (including critical care time)  Medications Ordered in UC Medications - No data to display   Initial Impression / Assessment and Plan / UC Course  I have reviewed the triage vital signs and the nursing notes.  Pertinent labs & imaging results that were available during my care of the patient were reviewed by me and considered in my medical decision making (see chart for details).       Final Clinical Impressions(s) / UC Diagnoses   Final diagnoses:  Cough  Bronchitis  Wheezing    ED Discharge Orders        Ordered    doxycycline (VIBRA-TABS) 100 MG tablet  2 times daily     11/26/17 1550    predniSONE (DELTASONE) 20 MG tablet  Daily     11/26/17 1550    albuterol (PROVENTIL HFA;VENTOLIN HFA) 108 (90 Base) MCG/ACT inhaler  Every 6 hours PRN     11/26/17 1550     1.diagnosis reviewed with patient 2. rx as per orders above; reviewed possible side effects, interactions, risks and benefits  3. Recommend supportive treatment with rest, fluids 4. Follow-up prn if symptoms worsen or don't improve  Controlled Substance Prescriptions [place] Controlled Substance Registry consulted? Not Applicable   Norval Gable, MD 11/26/17 [phone]

## 2017-11-26 NOTE — ED Triage Notes (Signed)
Pt has been having productive cough, chest congestion, chest tightness, sob which is worse at night and at work. Has been going on for over a week. Bp is high today but has been taking otc cough medications that isn't coricidin.

## 2017-11-29 ENCOUNTER — Encounter
Admission: RE | Disposition: A | Discharge status: Home / Self Care | Payer: Self-pay | Source: Ambulatory Visit | Attending: Gastroenterology

## 2017-11-29 ENCOUNTER — Ambulatory Visit
Admission: RE | Admit: 2017-11-29 | Discharge: 2017-11-29 | Disposition: A | Discharge status: Home / Self Care | Payer: BLUE CROSS/BLUE SHIELD | Source: Ambulatory Visit | Attending: Gastroenterology | Admitting: Gastroenterology

## 2017-11-29 ENCOUNTER — Ambulatory Visit: Payer: BLUE CROSS/BLUE SHIELD | Admitting: Certified Registered Nurse Anesthetist

## 2017-11-29 ENCOUNTER — Encounter: Payer: Self-pay | Admitting: *Deleted

## 2017-11-29 DIAGNOSIS — K621 Rectal polyp: Secondary | ICD-10-CM | POA: Insufficient documentation

## 2017-11-29 DIAGNOSIS — K210 Gastro-esophageal reflux disease with esophagitis: Secondary | ICD-10-CM | POA: Diagnosis not present

## 2017-11-29 DIAGNOSIS — Z7952 Long term (current) use of systemic steroids: Secondary | ICD-10-CM | POA: Insufficient documentation

## 2017-11-29 DIAGNOSIS — K2950 Unspecified chronic gastritis without bleeding: Secondary | ICD-10-CM | POA: Insufficient documentation

## 2017-11-29 DIAGNOSIS — Z1211 Encounter for screening for malignant neoplasm of colon: Secondary | ICD-10-CM | POA: Insufficient documentation

## 2017-11-29 DIAGNOSIS — Z8249 Family history of ischemic heart disease and other diseases of the circulatory system: Secondary | ICD-10-CM | POA: Insufficient documentation

## 2017-11-29 DIAGNOSIS — R1013 Epigastric pain: Secondary | ICD-10-CM

## 2017-11-29 DIAGNOSIS — D127 Benign neoplasm of rectosigmoid junction: Secondary | ICD-10-CM | POA: Diagnosis not present

## 2017-11-29 DIAGNOSIS — Z87891 Personal history of nicotine dependence: Secondary | ICD-10-CM | POA: Diagnosis not present

## 2017-11-29 DIAGNOSIS — D122 Benign neoplasm of ascending colon: Secondary | ICD-10-CM | POA: Insufficient documentation

## 2017-11-29 DIAGNOSIS — K3189 Other diseases of stomach and duodenum: Secondary | ICD-10-CM | POA: Diagnosis not present

## 2017-11-29 DIAGNOSIS — Z79899 Other long term (current) drug therapy: Secondary | ICD-10-CM | POA: Diagnosis not present

## 2017-11-29 HISTORY — PX: COLONOSCOPY WITH PROPOFOL: SHX5780

## 2017-11-29 HISTORY — PX: ESOPHAGOGASTRODUODENOSCOPY (EGD) WITH PROPOFOL: SHX5813

## 2017-11-29 SURGERY — COLONOSCOPY WITH PROPOFOL
Anesthesia: General

## 2017-11-29 MED ORDER — OMEPRAZOLE 40 MG PO CPDR: 40.0000 mg | DELAYED_RELEASE_CAPSULE | Freq: Two times a day (BID) | ORAL | 1 refills | Status: DC

## 2017-11-29 MED ORDER — PROPOFOL 500 MG/50ML IV EMUL
INTRAVENOUS | Status: AC
  Filled 2017-11-29: qty 50

## 2017-11-29 MED ORDER — PROPOFOL 500 MG/50ML IV EMUL
INTRAVENOUS | Status: DC | PRN
  Administered 2017-11-29: 125 ug/kg/min via INTRAVENOUS

## 2017-11-29 MED ORDER — LIDOCAINE HCL (CARDIAC) 20 MG/ML IV SOLN
INTRAVENOUS | Status: DC | PRN
  Administered 2017-11-29: 30 mg via INTRAVENOUS

## 2017-11-29 MED ORDER — PROPOFOL 10 MG/ML IV BOLUS
INTRAVENOUS | Status: DC | PRN
  Administered 2017-11-29: 40 mg via INTRAVENOUS
  Administered 2017-11-29: 50 mg via INTRAVENOUS
  Administered 2017-11-29: 100 mg via INTRAVENOUS

## 2017-11-29 MED ORDER — LIDOCAINE HCL (PF) 2 % IJ SOLN
INTRAMUSCULAR | Status: AC
  Filled 2017-11-29: qty 10

## 2017-11-29 MED ORDER — SODIUM CHLORIDE 0.9 % IV SOLN
INTRAVENOUS | Status: DC
  Administered 2017-11-29: 09:00:00 via INTRAVENOUS

## 2017-11-29 MED ORDER — PROPOFOL 10 MG/ML IV BOLUS
INTRAVENOUS | Status: AC
  Filled 2017-11-29: qty 20

## 2017-11-29 NOTE — Op Note (Signed)
[facility] Gastroenterology Patient Name: Oneill Bais Procedure Date: 11/29/2017 10:18 AM MRN: [id number] Account #: [id number] Date of Birth: [birth year] Admit Type: Outpatient Age: 52 Room: [facility] ENDO ROOM 4 Gender: Male Note Status: Finalized Procedure:            Upper GI endoscopy Indications:          Epigastric abdominal pain Providers:            Lin Landsman MD, MD Referring MD:         Rusty Aus, MD (Referring MD) Medicines:            Monitored Anesthesia Care Complications:        No immediate complications. Estimated blood loss: None. Procedure:            Pre-Anesthesia Assessment:                       - Prior to the procedure, a History and Physical was                        performed, and patient medications and allergies were                        reviewed. The patient is competent. The risks and                        benefits of the procedure and the sedation options and                        risks were discussed with the patient. All questions                        were answered and informed consent was obtained.                        Patient identification and proposed procedure were                        verified by the physician, the nurse, the                        anesthesiologist, the anesthetist and the technician in                        the pre-procedure area in the procedure room in the                        endoscopy suite. Mental Status Examination: alert and                        oriented. Airway Examination: normal oropharyngeal                        airway and neck mobility. Respiratory Examination:                        clear to auscultation. CV Examination: normal.                        Prophylactic Antibiotics: The patient does not require  prophylactic antibiotics. Prior Anticoagulants: The                        patient has taken no previous anticoagulant or          antiplatelet agents. ASA Grade Assessment: II - A                        patient with mild systemic disease. After reviewing the                        risks and benefits, the patient was deemed in                        satisfactory condition to undergo the procedure. The                        anesthesia plan was to use monitored anesthesia care                        (MAC). Immediately prior to administration of                        medications, the patient was re-assessed for adequacy                        to receive sedatives. The heart rate, respiratory rate,                        oxygen saturations, blood pressure, adequacy of                        pulmonary ventilation, and response to care were                        monitored throughout the procedure. The physical status                        of the patient was re-assessed after the procedure.                       After obtaining informed consent, the endoscope was                        passed under direct vision. Throughout the procedure,                        the patient's blood pressure, pulse, and oxygen                        saturations were monitored continuously. The Endoscope                        was introduced through the mouth, and advanced to the                        third part of duodenum. The upper GI endoscopy was                        accomplished without difficulty. The patient tolerated  the procedure well. Findings:      The duodenal bulb, second portion of the duodenum and third portion of       the duodenum were normal.      Diffuse severely erythematous mucosa without bleeding was found in the       gastric antrum. Biopsies were taken with a cold forceps for Helicobacter       pylori testing.      The cardia, gastric fundus, gastric body and incisura were normal.      The cardia and gastric fundus were normal on retroflexion.      Esophagogastric landmarks were  identified: the gastroesophageal junction       was found at 40 cm from the incisors.      LA Grade C (one or more mucosal breaks continuous between tops of 2 or       more mucosal folds, less than 75% circumference) esophagitis with no       bleeding was found in the lower third of the esophagus.      The upper third of the esophagus and middle third of the esophagus were       normal. Impression:           - Normal duodenal bulb, second portion of the duodenum                        and third portion of the duodenum.                       - Erythematous mucosa in the antrum. Biopsied.                       - Normal cardia, gastric fundus, gastric body and                        incisura.                       - Esophagogastric landmarks identified.                       - LA Grade C reflux esophagitis.                       - Normal upper third of esophagus and middle third of                        esophagus. Recommendation:       - Await pathology results.                       - Follow an antireflux regimen.                       - Use a proton pump inhibitor PO BID for 3 months.                       - Repeat upper endoscopy in 3 months to check healing.                       - Return to my office as previously scheduled. Procedure Code(s):    --- Professional ---  73668, Esophagogastroduodenoscopy, flexible, transoral;                        with biopsy, single or multiple Diagnosis Code(s):    --- Professional ---                       K31.89, Other diseases of stomach and duodenum                       K21.0, Gastro-esophageal reflux disease with esophagitis                       R10.13, Epigastric pain CPT copyright 2016 American Medical Association. All rights reserved. The codes documented in this report are preliminary and upon coder review may  be revised to meet current compliance requirements. Dr. Ulyess Mort Lin Landsman MD, MD 11/29/2017  10:44:24 AM This report has been signed electronically. Number of Addenda: 0 Note Initiated On: 11/29/2017 10:18 AM      [facility]

## 2017-11-29 NOTE — Op Note (Signed)
[facility] Gastroenterology Patient Name: Alan Santos Procedure Date: 11/29/2017 10:18 AM MRN: [id number] Account #: [id number] Date of Birth: Jul 10, 1966 Admit Type: Outpatient Age: 52 Room: [facility] ENDO ROOM 4 Gender: Male Note Status: Finalized Procedure:            Colonoscopy Indications:          Screening for colorectal malignant neoplasm, This is                        the patient's first colonoscopy Providers:            Lin Landsman MD, MD Medicines:            Monitored Anesthesia Care Complications:        No immediate complications. Estimated blood loss:                        Minimal. Procedure:            Pre-Anesthesia Assessment:                       - Prior to the procedure, a History and Physical was                        performed, and patient medications and allergies were                        reviewed. The patient is competent. The risks and                        benefits of the procedure and the sedation options and                        risks were discussed with the patient. All questions                        were answered and informed consent was obtained.                        Patient identification and proposed procedure were                        verified by the physician, the nurse, the                        anesthesiologist, the anesthetist and the technician in                        the pre-procedure area in the procedure room in the                        endoscopy suite. Mental Status Examination: alert and                        oriented. Airway Examination: normal oropharyngeal                        airway and neck mobility. Respiratory Examination:                        clear to auscultation. CV  Examination: normal.                        Prophylactic Antibiotics: The patient does not require                        prophylactic antibiotics. Prior Anticoagulants: The                        patient has taken  no previous anticoagulant or                        antiplatelet agents. ASA Grade Assessment: II - A                        patient with mild systemic disease. After reviewing the                        risks and benefits, the patient was deemed in                        satisfactory condition to undergo the procedure. The                        anesthesia plan was to use monitored anesthesia care                        (MAC). Immediately prior to administration of                        medications, the patient was re-assessed for adequacy                        to receive sedatives. The heart rate, respiratory rate,                        oxygen saturations, blood pressure, adequacy of                        pulmonary ventilation, and response to care were                        monitored throughout the procedure. The physical status                        of the patient was re-assessed after the procedure.                       After obtaining informed consent, the colonoscope was                        passed under direct vision. Throughout the procedure,                        the patient's blood pressure, pulse, and oxygen                        saturations were monitored continuously. The                        Colonoscope was introduced through the  anus and                        advanced to the the cecum, identified by appendiceal                        orifice and ileocecal valve. The colonoscopy was                        performed without difficulty. The patient tolerated the                        procedure well. The quality of the bowel preparation                        was evaluated using the BBPS [facility] Bowel Preparation                        Scale) with scores of: Right Colon = 3, Transverse                        Colon = 3 and Left Colon = 3 (entire mucosa seen well                        with no residual staining, small fragments of stool or                         opaque liquid). The total BBPS score equals 9. Findings:      The perianal and digital rectal examinations were normal. Pertinent       negatives include normal sphincter tone and no palpable rectal lesions.      Two sessile polyps were found in the ascending colon. The polyps were       diminutive in size. These polyps were removed with a cold biopsy       forceps. Resection and retrieval were complete.      Three sessile polyps were found in the recto-sigmoid colon. The polyps       were small in size. These polyps were removed with a cold snare.       Resection and retrieval were complete.      A 5 mm polyp was found in the rectum. The polyp was sessile. The polyp       was removed with a cold snare. Resection and retrieval were complete.      The retroflexed view of the distal rectum and anal verge was normal and       showed no anal or rectal abnormalities. Impression:           - Two diminutive polyps in the ascending colon, removed                        with a cold biopsy forceps. Resected and retrieved.                       - Three small polyps at the recto-sigmoid colon,                        removed with a cold snare. Resected and retrieved.                       -  One 5 mm polyp in the rectum, removed with a cold                        snare. Resected and retrieved.                       - The distal rectum and anal verge are normal on                        retroflexion view. Recommendation:       - Discharge patient to home.                       - Resume previous diet today.                       - Continue present medications.                       - Await pathology results.                       - Repeat colonoscopy in 3 - 5 years for surveillance                        based on pathology results.                       - Return to my office as previously scheduled. Procedure Code(s):    --- Professional ---                       [phone], Colonoscopy, flexible; with  removal of tumor(s),                        polyp(s), or other lesion(s) by snare technique                       45380, 27, Colonoscopy, flexible; with biopsy, single                        or multiple Diagnosis Code(s):    --- Professional ---                       Z12.11, Encounter for screening for malignant neoplasm                        of colon                       D12.2, Benign neoplasm of ascending colon                       D12.7, Benign neoplasm of rectosigmoid junction                       K62.1, Rectal polyp CPT copyright 2016 American Medical Association. All rights reserved. The codes documented in this report are preliminary and upon coder review may  be revised to meet current compliance requirements. Dr. Ulyess Mort Lin Landsman MD, MD 11/29/2017 11:11:33 AM This report has been signed electronically. Number of Addenda: 0 Note Initiated On: 11/29/2017 10:18 AM Scope Withdrawal Time:  0 hours 19 minutes 13 seconds  Total Procedure Duration: 0 hours 22 minutes 1 second       [facility]

## 2017-11-29 NOTE — Transfer of Care (Signed)
Immediate Anesthesia Transfer of Care Note  Patient: Alan Santos  Procedure(s) Performed: COLONOSCOPY WITH PROPOFOL (N/A ) ESOPHAGOGASTRODUODENOSCOPY (EGD) WITH PROPOFOL (N/A )  Patient Location: PACU and Endoscopy Unit  Anesthesia Type:General  Level of Consciousness: sedated  Airway & Oxygen Therapy: Patient Spontanous Breathing  Post-op Assessment: Report given to RN  Post vital signs: stable  Last Vitals:  Vitals:   11/29/17 0856 11/29/17 1113  BP: (!) 153/94   Pulse: 73 74  Resp: 18 12  Temp: 36.6 C (!) 35.6 C  SpO2: 100% 98%    Last Pain:  Vitals:   11/29/17 1113  TempSrc: Tympanic  PainSc:          Complications: No apparent anesthesia complications

## 2017-11-29 NOTE — Anesthesia Postprocedure Evaluation (Signed)
Anesthesia Post Note  Patient: Alan Santos  Procedure(s) Performed: COLONOSCOPY WITH PROPOFOL (N/A ) ESOPHAGOGASTRODUODENOSCOPY (EGD) WITH PROPOFOL (N/A )  Patient location during evaluation: Endoscopy Anesthesia Type: General Level of consciousness: awake and alert Pain management: pain level controlled Vital Signs Assessment: post-procedure vital signs reviewed and stable Respiratory status: spontaneous breathing and respiratory function stable Cardiovascular status: stable Anesthetic complications: no     Last Vitals:  Vitals:   11/29/17 1113 11/29/17 1114  BP:  (!) 91/57  Pulse: 74 73  Resp: 12 12  Temp: (!) 35.6 C   SpO2: 98% 98%    Last Pain:  Vitals:   11/29/17 1113  TempSrc: Tympanic  PainSc:                  KEPHART,WILLIAM K

## 2017-11-29 NOTE — H&P (Signed)
Cephas Darby, MD [address]  [place]  [place], [place] [address]  Main: [phone]  Fax: [phone] Pager: [phone]  Primary Care Physician:  Rusty Aus, MD Primary Gastroenterologist:  Dr. Cephas Darby  Pre-Procedure History & Physical: HPI:  Alan Santos is a 52 y.o. male is here for an endoscopy and colonoscopy.   History reviewed. No pertinent past medical history.  Past Surgical History:  Procedure Laterality Date  . BACK SURGERY    . HERNIA REPAIR    . KNEE SURGERY Right     Prior to Admission medications   Medication Sig Start Date End Date Taking? Authorizing Provider  albuterol (PROVENTIL HFA;VENTOLIN HFA) 108 (90 Base) MCG/ACT inhaler Inhale 1-2 puffs into the lungs every 6 (six) hours as needed for wheezing or shortness of breath. 11/26/17  Yes Norval Gable, MD  doxycycline (VIBRA-TABS) 100 MG tablet Take 1 tablet (100 mg total) by mouth 2 (two) times daily. 11/26/17  Yes Norval Gable, MD  predniSONE (DELTASONE) 20 MG tablet Take 1 tablet (20 mg total) by mouth daily. 11/26/17  Yes Norval Gable, MD  ranitidine (ZANTAC) 150 MG tablet Take 1 tablet (150 mg total) by mouth 2 (two) times daily. 11/03/17 11/03/18  Merlyn Lot, MD    Allergies as of 11/13/2017  . (No Known Allergies)    Family History  Problem Relation Age of Onset  . CAD Mother   . Kidney Stones Father     Social History   Socioeconomic History  . Marital status: Married    Spouse name: Not on file  . Number of children: Not on file  . Years of education: Not on file  . Highest education level: Not on file  Social Needs  . Financial resource strain: Not on file  . Food insecurity - worry: Not on file  . Food insecurity - inability: Not on file  . Transportation needs - medical: Not on file  . Transportation needs - non-medical: Not on file  Occupational History  . Not on file  Tobacco Use  . Smoking status: Former Smoker    Types: Cigarettes   Last attempt to quit: 10/26/2017    Years since quitting: 0.0  . Smokeless tobacco: Never Used  Substance and Sexual Activity  . Alcohol use: Yes    Comment: occasionally  . Drug use: No  . Sexual activity: Not on file  Other Topics Concern  . Not on file  Social History Narrative  . Not on file    Review of Systems: See HPI, otherwise negative ROS  Physical Exam: BP (!) 153/94   Pulse 73   Temp 97.8 F (36.6 C) (Tympanic)   Resp 18   Ht 5\' 11"  (1.803 m)   Wt 225 lb (102.1 kg)   SpO2 100%   BMI 31.38 kg/m  General:   Alert,  pleasant and cooperative in NAD Head:  Normocephalic and atraumatic. Neck:  Supple; no masses or thyromegaly. Lungs:  Clear throughout to auscultation.    Heart:  Regular rate and rhythm. Abdomen:  Soft, nontender and nondistended. Normal bowel sounds, without guarding, and without rebound.   Neurologic:  Alert and  oriented x4;  grossly normal neurologically.  Impression/Plan: Alan Santos is here for an endoscopy and colonoscopy to be performed for colon cancer screening, epigastric pain and variceal screening  Risks, benefits, limitations, and alternatives regarding  endoscopy and colonoscopy have been reviewed with the patient.  Questions have been answered.  All  parties agreeable.   Sherri Sear, MD  11/29/2017, 10:13 AM

## 2017-11-29 NOTE — Anesthesia Post-op Follow-up Note (Signed)
Anesthesia QCDR form completed.        
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
## 2017-11-30 LAB — SURGICAL PATHOLOGY

## 2017-12-03 ENCOUNTER — Encounter: Payer: Self-pay | Admitting: Gastroenterology

## 2017-12-04 ENCOUNTER — Other Ambulatory Visit: Payer: Self-pay

## 2017-12-04 MED ORDER — ESOMEPRAZOLE MAGNESIUM 40 MG PO CPDR: 40.0000 mg | DELAYED_RELEASE_CAPSULE | Freq: Two times a day (BID) | ORAL | 1 refills | Status: DC

## 2017-12-05 DIAGNOSIS — D369 Benign neoplasm, unspecified site: Secondary | ICD-10-CM | POA: Insufficient documentation

## 2017-12-06 ENCOUNTER — Other Ambulatory Visit: Payer: Self-pay

## 2017-12-12 ENCOUNTER — Encounter: Payer: Self-pay | Admitting: Gastroenterology

## 2017-12-12 ENCOUNTER — Encounter (INDEPENDENT_AMBULATORY_CARE_PROVIDER_SITE_OTHER): Payer: Self-pay

## 2017-12-12 ENCOUNTER — Other Ambulatory Visit
Admission: RE | Admit: 2017-12-12 | Discharge: 2017-12-12 | Disposition: A | Discharge status: Home / Self Care | Payer: BLUE CROSS/BLUE SHIELD | Source: Ambulatory Visit | Attending: Gastroenterology | Admitting: Gastroenterology

## 2017-12-12 ENCOUNTER — Ambulatory Visit (INDEPENDENT_AMBULATORY_CARE_PROVIDER_SITE_OTHER): Payer: BLUE CROSS/BLUE SHIELD | Admitting: Gastroenterology

## 2017-12-12 VITALS — BP 172/104 | Ht 71.0 in | Wt 236.2 lb

## 2017-12-12 DIAGNOSIS — K7469 Other cirrhosis of liver: Secondary | ICD-10-CM

## 2017-12-12 DIAGNOSIS — K640 First degree hemorrhoids: Secondary | ICD-10-CM

## 2017-12-12 DIAGNOSIS — D126 Benign neoplasm of colon, unspecified: Secondary | ICD-10-CM

## 2017-12-12 DIAGNOSIS — K2210 Ulcer of esophagus without bleeding: Secondary | ICD-10-CM

## 2017-12-12 LAB — HEPATIC FUNCTION PANEL
ALK PHOS: 89 U/L (ref 38–126)
ALT: 29 U/L (ref 17–63)
AST: 29 U/L (ref 15–41)
Albumin: 4.3 g/dL (ref 3.5–5.0)
Total Bilirubin: 1.1 mg/dL (ref 0.3–1.2)
Total Protein: 7.6 g/dL (ref 6.5–8.1)

## 2017-12-12 NOTE — Progress Notes (Signed)
Alan Darby, MD [address]  [place]  Alan Santos, [place] [address]  Main: [phone]  Fax: [phone]    Gastroenterology Consultation  Referring Provider:     Rusty Aus, MD Primary Care Physician:  Alan Aus, MD Primary Gastroenterologist:  Dr. Cephas Santos Reason for Consultation:     Acute upper abdominal pain        HPI:   Alan Santos is a 52 y.o. male referred by Dr. Sabra Heck, Alan Grief, MD  for consultation & management of acute upper abdominal pain. He went to ER on 11/03/2017 secondary to sudden onset of upper abdominal sharp pain in the epigastric/substernal region radiating to right upper quadrant. Pain has been constant with no particular relation to food. He had similar episodes in the past, underwent ultrasound, HIDA scan in 2009, CT A/P in 2012 which revealed fatty liver, mild splenomegaly as well as retained stool in colon. He had CBC which revealed mild thrombocytopenia, normal CMP, lipase and troponin which were unremarkable in the ER. He is referred from ER for further evaluation. Patient consumes red meat most of the days in a week, he smokes for several years, stopped smoking about a week ago. He consumes alcohol over the weekends. He denies weight loss, loss of appetite, fever, chills, nausea, vomiting. He has noticed that his bowel movements have been irregular associated with hard stools. He also reports that he has been dealing with symptomatic hemorrhoids for a long time. He is prescribed ranitidine 150 mg twice daily from the ER.  Follow-up visit 12/12/2017: He reports feeling better since last follow-up. He had EGD which revealed severe erosive esophagitis. Apparently, patient has not been taking proton pump inhibitor. He is taking ranitidine 150 mg daily. He denies heartburn, regurgitation, epigastric pain, nausea, vomiting. He reports that his bowel movements have been regular, has symptoms secondary to hemorrhoids. His blood pressure is  elevated in clinic today. He denies headache, chest pain, shortness of breath, tingling or numbness or weakness in his extremities.   NSAIDs: Occasional  Antiplts/Anticoagulants/Anti thrombotics: None  GI Procedures:  EGD 11/29/2017 - Normal duodenal bulb, second portion of the duodenum and third portion of the duodenum. - Erythematous mucosa in the antrum. Biopsied. - Normal cardia, gastric fundus, gastric body and incisura. - Esophagogastric landmarks identified. - LA Grade C reflux esophagitis. - Normal upper third of esophagus and middle third of esophagus.  Colonoscopy 11/29/2017 - Two diminutive polyps in the ascending colon, removed with a cold biopsy forceps. Resected and retrieved. - Three small polyps at the recto-sigmoid colon, removed with a cold snare. Resected and retrieved. - One 5 mm polyp in the rectum, removed with a cold snare. Resected and retrieved. - The distal rectum and anal verge are normal on retroflexion view.  DIAGNOSIS:  A. STOMACH; COLD BIOPSY:  - ANTRAL MUCOSA WITH REGENERATIVE CHANGE AND MINIMAL CHRONIC GASTRITIS.  - OXYNTIC MUCOSA WITH FOCAL MILD CHRONIC GASTRITIS AND VASCULAR  CONGESTION.  - NEGATIVE FOR H. PYLORI, DYSPLASIA AND MALIGNANCY.   B. COLON POLYP 2, ASCENDING; COLD BIOPSY:  - SESSILE SERRATED ADENOMA (1), NEGATIVE FOR CYTOLOGIC DYSPLASIA AND  MALIGNANCY.  - MILD MUCOSAL EDEMA (1).   C. COLON POLYP 3, RECTOSIGMOID; COLD SNARE:  - TUBULAR ADENOMA (1 OF 2 FRAGMENTS), NEGATIVE FOR HIGH-GRADE DYSPLASIA  AND MALIGNANCY.   D. RECTUM POLYP; COLD SNARE:  - HYPERPLASTIC POLYP.  - NEGATIVE FOR DYSPLASIA AND MALIGNANCY.  Denies family history of GI malignancy No prior abdominal  surgeries  History reviewed. No pertinent past medical history.  Past Surgical History:  Procedure Laterality Date  . BACK SURGERY    . COLONOSCOPY WITH PROPOFOL N/A 11/29/2017   Procedure: COLONOSCOPY WITH PROPOFOL;  Surgeon: Lin Landsman, MD;   Location: [facility] ENDOSCOPY;  Service: Gastroenterology;  Laterality: N/A;  . ESOPHAGOGASTRODUODENOSCOPY (EGD) WITH PROPOFOL N/A 11/29/2017   Procedure: ESOPHAGOGASTRODUODENOSCOPY (EGD) WITH PROPOFOL;  Surgeon: Lin Landsman, MD;  Location: [facility] ENDOSCOPY;  Service: Gastroenterology;  Laterality: N/A;  . HERNIA REPAIR    . KNEE SURGERY Right     Prior to Admission medications   Medication Sig Start Date End Date Taking? Authorizing Provider  ranitidine (ZANTAC) 150 MG tablet Take 1 tablet (150 mg total) by mouth 2 (two) times daily. 11/03/17 11/03/18 Yes Merlyn Lot, MD    Family History  Problem Relation Age of Onset  . CAD Mother   . Kidney Stones Father      Social History   Tobacco Use  . Smoking status: Former Smoker    Types: Cigarettes    Last attempt to quit: 10/26/2017    Years since quitting: 0.1  . Smokeless tobacco: Never Used  Substance Use Topics  . Alcohol use: Yes    Comment: occasionally  . Drug use: No    Allergies as of 12/12/2017  . (No Known Allergies)    Review of Systems:    All systems reviewed and negative except where noted in HPI.   Physical Exam:  BP (!) 172/104   Ht 5\' 11"  (1.803 m)   Wt 236 lb 3.2 oz (107.1 kg)   BMI 32.94 kg/m  No LMP for male patient.  General:   Alert,  Well-developed, well-nourished, pleasant and cooperative in NAD Head:  Normocephalic and atraumatic. Eyes:  Sclera clear, no icterus.   Conjunctiva pink. Ears:  Normal auditory acuity. Nose:  No deformity, discharge, or lesions. Mouth:  No deformity or lesions,oropharynx pink & moist. Neck:  Supple; no masses or thyromegaly. Lungs:  Respirations even and unlabored.  Clear throughout to auscultation.   No wheezes, crackles, or rhonchi. No acute distress. Heart:  Regular rate and rhythm; no murmurs, clicks, rubs, or gallops. Abdomen:  Normal bowel sounds. Soft, mild epigastric tenderness and non-distended without masses, mild splenomegaly, no hepatomegaly or  hernias noted.  No guarding or rebound tenderness.   Rectal: Not performed Msk:  Symmetrical without gross deformities. Good, equal movement & strength bilaterally. Pulses:  Normal pulses noted. Extremities:  No clubbing or edema.  No cyanosis. Neurologic:  Alert and oriented x3;  grossly normal neurologically. Skin:  Intact without significant lesions or rashes. No jaundice. Lymph Nodes:  No significant cervical adenopathy. Psych:  Alert and cooperative. Normal mood and affect.  Imaging Studies: Reviewed both ultrasound and CT  Assessment and Plan:   JAHVON GOSLINE is a 52 y.o. white male here for follow-up of epigastric pain, fatty liver, splenomegaly with thrombocytopenia secondary to cirrhosis, as well as symptomatic hemorrhoids.   Upper abdominal pain secondary to LA garde C esophagitis - Continue Nexium 40 mg 2 times daily - Antireflux measures - Repeat EGD in 3 months to confirm healing and evaluate for underlying Barrett's  Cirrhosis: He has imaging and biochemical evidence of cirrhosis manifested as portal hypertension. Well compensated. Child's class A, low MELD-Na Etiology most likely secondary to fatty liver - Complete secondary liver disease workup Portal hypertension: Splenomegaly and thrombocytopenia - EGD did not reveal varices - Repeat EGD in 3 years or  sooner if there is decompensation - No evidence of anemia and coagulopathy - HCC screening: No evidence of liver lesions on CT in 11/2017, check AFP Ultrasound and AFP levels every 6 months - PSE none  Colon polyps: Colonoscopy in 11/2017 revealed subcentimeter polyps . He had sessile serrated adenoma and tubular adenoma - Repeat surveillance colonoscopy in 11/2022  Symptomatic hemorrhoids: He reports itching, burning, swelling, prolapse and blood on wiping Discussed with him about outpatient hemorrhoid ligation and patient is interested in it Suggested him to continue high-fiber diet and fiber supplements,    Apply Preparation H or hydrocortisone cream 1-2 times daily Suggested him to use witch hazel pads R Tucks pads for perianal itching, discomfort or swelling  Follow up in 4 weeks   Alan Darby, MD

## 2017-12-13 LAB — ANTI-SMOOTH MUSCLE ANTIBODY, IGG: F-Actin IgG: 5 Units (ref 0–19)

## 2017-12-13 LAB — MITOCHONDRIAL ANTIBODIES: Mitochondrial M2 Ab, IgG: <20 Units (ref 0.0–20.0)

## 2017-12-13 LAB — IGG, IGA, IGM
IGA: 238 mg/dL (ref 90–386)
IGG (IMMUNOGLOBIN G), SERUM: 975 mg/dL (ref 700–1600)
IgM (Immunoglobulin M), Srm: 109 mg/dL (ref 20–172)

## 2017-12-13 LAB — HEPATITIS C ANTIBODY: HCV Ab: <0.1 s/co ratio (ref 0.0–0.9)

## 2017-12-13 LAB — ALPHA-1-ANTITRYPSIN: A-1 Antitrypsin, Ser: 145 mg/dL (ref 90–200)

## 2017-12-13 LAB — CERULOPLASMIN: Ceruloplasmin: 21.1 mg/dL (ref 16.0–31.0)

## 2017-12-13 LAB — HEPATITIS B SURFACE ANTIBODY,QUALITATIVE: HEP B S AB: NONREACTIVE

## 2017-12-13 LAB — HEPATITIS A ANTIBODY, TOTAL: Hep A Total Ab: NEGATIVE

## 2017-12-13 LAB — HEPATITIS B SURFACE ANTIGEN: Hepatitis B Surface Ag: NEGATIVE

## 2017-12-14 LAB — ANTINUCLEAR ANTIBODIES, IFA: ANTINUCLEAR ANTIBODIES, IFA: NEGATIVE

## 2017-12-18 ENCOUNTER — Telehealth: Payer: Self-pay

## 2017-12-18 NOTE — Telephone Encounter (Signed)
LVM for pt to return my call.

## 2017-12-18 NOTE — Telephone Encounter (Signed)
-----   Message from Lin Landsman, MD sent at 12/13/2017  5:39 PM EST ----- Please inform patient that the workup for other causes of cirrhosis came back normal. His LFTs are normal. I recommend liver biopsy to confirm diagnosis of cirrhosis/advanced fibrosis   Sherri Sear, M.D.

## 2017-12-19 NOTE — Telephone Encounter (Signed)
PT LEFT VM TO GET MORE INFORMATION ABOUT HIS RESULTS PLEASE CALL

## 2017-12-19 NOTE — Telephone Encounter (Signed)
LVM for pt to return my call.

## 2017-12-20 NOTE — Telephone Encounter (Signed)
Pt is calling for Alan Santos, he missed call for results also his insurance is not covering rx nexium and Dr. Marius Ditch wanted to know if he had any issues with that.please call pt back

## 2017-12-21 NOTE — Telephone Encounter (Signed)
Pt returned call and results were discussed with him by [facility]. Pt will discuss having a liver biopsy with Dr. Marius Ditch at next office visit.

## 2018-01-11 ENCOUNTER — Ambulatory Visit: Payer: BLUE CROSS/BLUE SHIELD | Admitting: Gastroenterology

## 2018-01-18 ENCOUNTER — Ambulatory Visit (INDEPENDENT_AMBULATORY_CARE_PROVIDER_SITE_OTHER): Payer: BLUE CROSS/BLUE SHIELD | Admitting: Gastroenterology

## 2018-01-18 ENCOUNTER — Encounter (INDEPENDENT_AMBULATORY_CARE_PROVIDER_SITE_OTHER): Payer: Self-pay

## 2018-01-18 ENCOUNTER — Encounter: Payer: Self-pay | Admitting: Gastroenterology

## 2018-01-18 VITALS — BP 141/105 | HR 76 | Ht 71.0 in | Wt 231.8 lb

## 2018-01-18 DIAGNOSIS — K760 Fatty (change of) liver, not elsewhere classified: Secondary | ICD-10-CM | POA: Diagnosis not present

## 2018-01-18 DIAGNOSIS — K641 Second degree hemorrhoids: Secondary | ICD-10-CM

## 2018-01-18 DIAGNOSIS — K7460 Unspecified cirrhosis of liver: Secondary | ICD-10-CM | POA: Diagnosis not present

## 2018-01-18 DIAGNOSIS — K2210 Ulcer of esophagus without bleeding: Secondary | ICD-10-CM

## 2018-01-18 MED ORDER — CLOTRIMAZOLE-BETAMETHASONE 1-0.05 % EX CREA: 1.0000 "application " | TOPICAL_CREAM | Freq: Two times a day (BID) | CUTANEOUS | 0 refills | Status: AC

## 2018-01-18 MED ORDER — HYDROCORTISONE 2.5 % RE CREA: 1.0000 "application " | TOPICAL_CREAM | Freq: Two times a day (BID) | RECTAL | 1 refills | Status: AC

## 2018-01-18 NOTE — Progress Notes (Signed)
Alan Darby, MD [address]  [place]  [place], [place] [address]  Main: [phone]  Fax: [phone]    Gastroenterology Consultation  Referring Provider:     Rusty Aus, MD Primary Care Physician:  Rusty Aus, MD Primary Gastroenterologist:  Dr. Cephas Santos Reason for Consultation:   compensated cirrhosis and symptomatic hemorrhoids        HPI:   Alan Santos is a 52 y.o. male referred by Dr. Sabra Heck, Christean Grief, MD  for consultation & management of acute upper abdominal pain. He went to ER on 11/03/2017 secondary to sudden onset of upper abdominal sharp pain in the epigastric/substernal region radiating to right upper quadrant. Pain has been constant with no particular relation to food. He had similar episodes in the past, underwent ultrasound, HIDA scan in 2009, CT A/P in 2012 which revealed fatty liver, mild splenomegaly as well as retained stool in colon. He had CBC which revealed mild thrombocytopenia, normal CMP, lipase and troponin which were unremarkable in the ER. He is referred from ER for further evaluation. Patient consumes red meat most of the days in a week, he smokes for several years, stopped smoking about a week ago. He consumes alcohol over the weekends. He denies weight loss, loss of appetite, fever, chills, nausea, vomiting. He has noticed that his bowel movements have been irregular associated with hard stools. He also reports that he has been dealing with symptomatic hemorrhoids for a long time. He is prescribed ranitidine 150 mg twice daily from the ER.  Follow-up visit 12/12/2017: He reports feeling better since last follow-up. He had EGD which revealed severe erosive esophagitis. Apparently, patient has not been taking proton pump inhibitor. He is taking ranitidine 150 mg daily. He denies heartburn, regurgitation, epigastric pain, nausea, vomiting. He reports that his bowel movements have been regular, has symptoms secondary to hemorrhoids.  His blood pressure is elevated in clinic today. He denies headache, chest pain, shortness of breath, tingling or numbness or weakness in his extremities.  Follow-up visit 01/18/2018 He is here for treatment of hemorrhoids. His secondary liver disease workup came back unremarkable. Patient has not been taking PPI for his erosive esophagitis, Nexium and Prilosec are not covered by his insurance. He is currently taking ranitidine once daily   NSAIDs: Occasional  Antiplts/Anticoagulants/Anti thrombotics: None  GI Procedures:  EGD 11/29/2017 - Normal duodenal bulb, second portion of the duodenum and third portion of the duodenum. - Erythematous mucosa in the antrum. Biopsied. - Normal cardia, gastric fundus, gastric body and incisura. - Esophagogastric landmarks identified. - LA Grade C reflux esophagitis. - Normal upper third of esophagus and middle third of esophagus.  Colonoscopy 11/29/2017 - Two diminutive polyps in the ascending colon, removed with a cold biopsy forceps. Resected and retrieved. - Three small polyps at the recto-sigmoid colon, removed with a cold snare. Resected and retrieved. - One 5 mm polyp in the rectum, removed with a cold snare. Resected and retrieved. - The distal rectum and anal verge are normal on retroflexion view.  DIAGNOSIS:  A. STOMACH; COLD BIOPSY:  - ANTRAL MUCOSA WITH REGENERATIVE CHANGE AND MINIMAL CHRONIC GASTRITIS.  - OXYNTIC MUCOSA WITH FOCAL MILD CHRONIC GASTRITIS AND VASCULAR  CONGESTION.  - NEGATIVE FOR H. PYLORI, DYSPLASIA AND MALIGNANCY.   B. COLON POLYP 2, ASCENDING; COLD BIOPSY:  - SESSILE SERRATED ADENOMA (1), NEGATIVE FOR CYTOLOGIC DYSPLASIA AND  MALIGNANCY.  - MILD MUCOSAL EDEMA (1).   C. COLON POLYP 3, RECTOSIGMOID; COLD  SNARE:  - TUBULAR ADENOMA (1 OF 2 FRAGMENTS), NEGATIVE FOR HIGH-GRADE DYSPLASIA  AND MALIGNANCY.   D. RECTUM POLYP; COLD SNARE:  - HYPERPLASTIC POLYP.  - NEGATIVE FOR DYSPLASIA AND MALIGNANCY.  Denies  family history of GI malignancy No prior abdominal surgeries  History reviewed. No pertinent past medical history.  Past Surgical History:  Procedure Laterality Date  . BACK SURGERY    . COLONOSCOPY WITH PROPOFOL N/A 11/29/2017   Procedure: COLONOSCOPY WITH PROPOFOL;  Surgeon: Lin Landsman, MD;  Location: [facility] ENDOSCOPY;  Service: Gastroenterology;  Laterality: N/A;  . ESOPHAGOGASTRODUODENOSCOPY (EGD) WITH PROPOFOL N/A 11/29/2017   Procedure: ESOPHAGOGASTRODUODENOSCOPY (EGD) WITH PROPOFOL;  Surgeon: Lin Landsman, MD;  Location: [facility] ENDOSCOPY;  Service: Gastroenterology;  Laterality: N/A;  . HERNIA REPAIR    . KNEE SURGERY Right      Current Outpatient Medications:  .  albuterol (PROVENTIL HFA;VENTOLIN HFA) 108 (90 Base) MCG/ACT inhaler, Inhale 1-2 puffs into the lungs every 6 (six) hours as needed for wheezing or shortness of breath., Disp: 1 Inhaler, Rfl: 0 .  albuterol (PROVENTIL HFA;VENTOLIN HFA) 108 (90 Base) MCG/ACT inhaler, INHALE 1-2 PUFFS INTO THE LUNGS EVERY 6 (SIX) HOURS AS NEEDED FOR WHEEZING OR SHORTNESS OF BREATH., Disp: , Rfl: 0 .  ranitidine (ZANTAC) 150 MG tablet, Take 150 mg by mouth 2 (two) times daily., Disp: , Rfl: 1 .  tamsulosin (FLOMAX) 0.4 MG CAPS capsule, Take by mouth., Disp: , Rfl:  .  clotrimazole-betamethasone (LOTRISONE) cream, Apply 1 application topically 2 (two) times daily., Disp: 30 g, Rfl: 0 .  hydrocortisone (ANUSOL-HC) 2.5 % rectal cream, Place 1 application rectally 2 (two) times daily., Disp: 30 g, Rfl: 1   Family History  Problem Relation Age of Onset  . CAD Mother   . Kidney Stones Father      Social History   Tobacco Use  . Smoking status: Former Smoker    Types: Cigarettes    Last attempt to quit: 10/26/2017    Years since quitting: 0.2  . Smokeless tobacco: Never Used  Substance Use Topics  . Alcohol use: Yes    Comment: occasionally  . Drug use: No    Allergies as of 01/18/2018  . (No Known Allergies)     Review of Systems:    All systems reviewed and negative except where noted in HPI.   Physical Exam:  BP (!) 141/105   Pulse 76   Ht 5\' 11"  (1.803 m)   Wt 231 lb 12.8 oz (105.1 kg)   BMI 32.33 kg/m  No LMP for male patient.  General:   Alert,  Well-developed, well-nourished, pleasant and cooperative in NAD Head:  Normocephalic and atraumatic. Eyes:  Sclera clear, no icterus.   Conjunctiva pink. Ears:  Normal auditory acuity. Nose:  No deformity, discharge, or lesions. Mouth:  No deformity or lesions,oropharynx pink & moist. Neck:  Supple; no masses or thyromegaly. Lungs:  Respirations even and unlabored.  Clear throughout to auscultation.   No wheezes, crackles, or rhonchi. No acute distress. Heart:  Regular rate and rhythm; no murmurs, clicks, rubs, or gallops. Abdomen:  Normal bowel sounds. Soft, mild epigastric tenderness and non-distended without masses, mild splenomegaly, no hepatomegaly or hernias noted.  No guarding or rebound tenderness.   Rectal: he does have perianal fungal rash, external hemorrhoids with skin tag Msk:  Symmetrical without gross deformities. Good, equal movement & strength bilaterally. Pulses:  Normal pulses noted. Extremities:  No clubbing or edema.  No cyanosis. Neurologic:  Alert  and oriented x3;  grossly normal neurologically. Skin:  Intact without significant lesions or rashes. No jaundice. Lymph Nodes:  No significant cervical adenopathy. Psych:  Alert and cooperative. Normal mood and affect.  Imaging Studies: Reviewed both ultrasound and CT  Assessment and Plan:   SYLVESTRE RATHGEBER is a 52 y.o. white male here for follow-up of epigastric pain, fatty liver, splenomegaly with thrombocytopenia secondary to cirrhosis, as well as symptomatic hemorrhoids.   Upper abdominal pain secondary to LA garde C esophagitis - Nexium and Prilosec are not covered by his insurance. We will try Zegerid 40 mg twice daily - Antireflux measures - Repeat EGD in  3 months to confirm healing and evaluate for underlying Barrett's  Cirrhosis: He has imaging and biochemical evidence of cirrhosis manifested as portal hypertension. Well compensated. Child's class A, low MELD-Na Etiology most likely secondary to fatty liver - Secondary liver disease workup has been negative. I recommend liver biopsy to confirm cirrhosis, patient will think about it Portal hypertension: Splenomegaly and thrombocytopenia - EGD did not reveal varices - Repeat EGD in 3 years or sooner if there is decompensation - No evidence of anemia and coagulopathy - HCC screening: No evidence of liver lesions on CT in 11/2017, check AFP Ultrasound and AFP levels every 6 months - PSE none  Colon polyps: Colonoscopy in 11/2017 revealed subcentimeter polyps . He had sessile serrated adenoma and tubular adenoma - Repeat surveillance colonoscopy in 11/2022  Symptomatic hemorrhoids: He reports itching, burning, swelling, prolapse and blood on wiping will perform hemorrhoid ligation today Suggested him to continue high-fiber diet and fiber supplements Recommend Lotrisone cream for fungal rash Apply hydrocortisone cream 1-2 times daily   Follow up in 2 weeks   Alan Darby, MD

## 2018-02-01 ENCOUNTER — Ambulatory Visit: Payer: BLUE CROSS/BLUE SHIELD | Admitting: Gastroenterology

## 2020-05-18 ENCOUNTER — Ambulatory Visit
Admission: RE | Admit: 2020-05-18 | Discharge: 2020-05-18 | Disposition: A | Discharge status: Home / Self Care | Payer: BC Managed Care – PPO | Source: Ambulatory Visit | Attending: Family Medicine | Admitting: Family Medicine

## 2020-05-18 ENCOUNTER — Other Ambulatory Visit: Payer: Self-pay

## 2020-05-18 ENCOUNTER — Other Ambulatory Visit: Payer: Self-pay | Admitting: Family Medicine

## 2020-05-18 DIAGNOSIS — R110 Nausea: Secondary | ICD-10-CM | POA: Diagnosis present

## 2020-05-18 DIAGNOSIS — R1013 Epigastric pain: Secondary | ICD-10-CM | POA: Insufficient documentation

## 2020-05-18 MED ORDER — IOHEXOL 350 MG/ML SOLN
100.0000 mL | Freq: Once | INTRAVENOUS | Status: AC | PRN
  Administered 2020-05-18: 100 mL via INTRAVENOUS

## 2021-01-08 IMAGING — CT CT ABD-PELV W/ CM
2 of 5 series · 15 of 46 positions shown, 17 images · IV contrast (omnipaque)
Comparison: Abdominal CT 11/23/2017

CLINICAL DATA: Epigastric pain for 2-3 days. Nausea. Elevated LFTs.

EXAM:
CT ABDOMEN AND PELVIS WITH CONTRAST
TECHNIQUE: Multidetector CT imaging of the abdomen and pelvis was performed
using the standard protocol following bolus administration of
intravenous contrast.
CONTRAST:  100mL OMNIPAQUE IOHEXOL 350 MG/ML SOLN

[Series 2: routine abd/pel with · axial · 0.90mm/px · z∈[-1084,-634]mm · 12 of 104 slices shown, 14 images]
[im 7/104  soft-tissue]
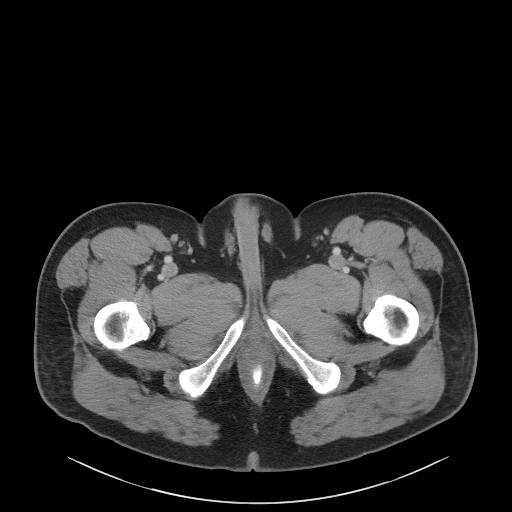
[im 7/104  bone]
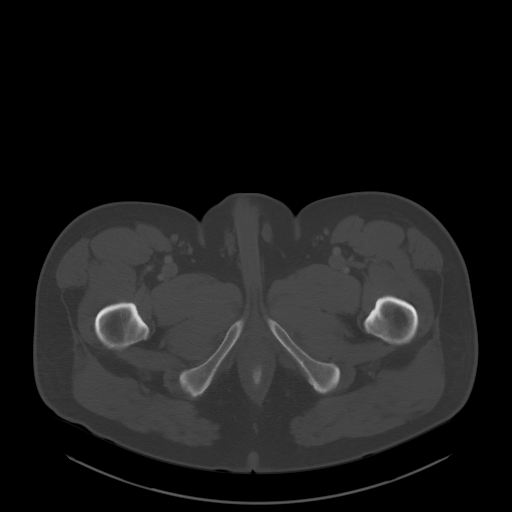
[im 19/104  soft-tissue]
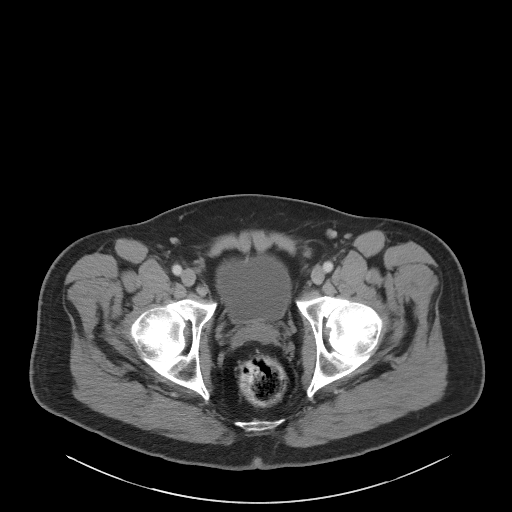
[im 25/104  soft-tissue]
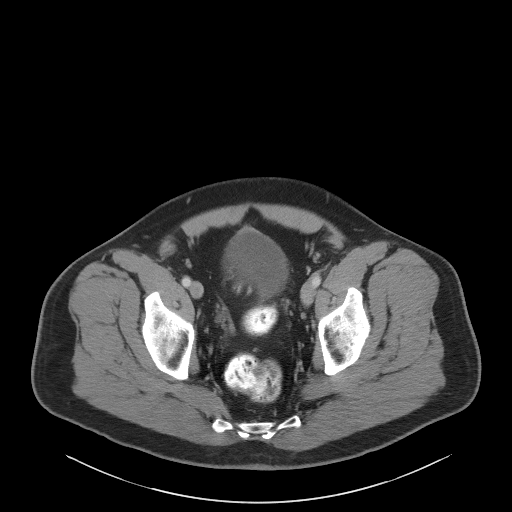
[im 31/104  soft-tissue]
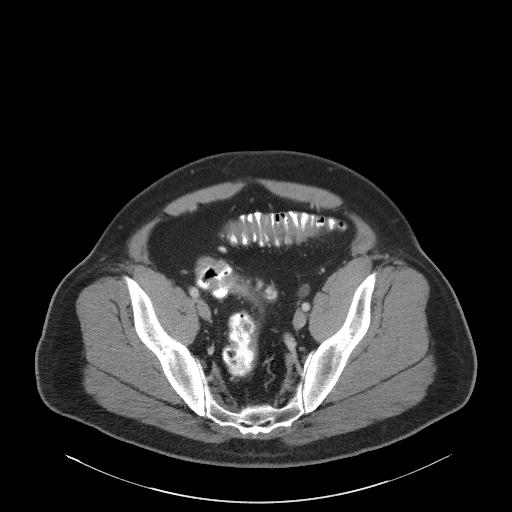
[im 43/104  soft-tissue]
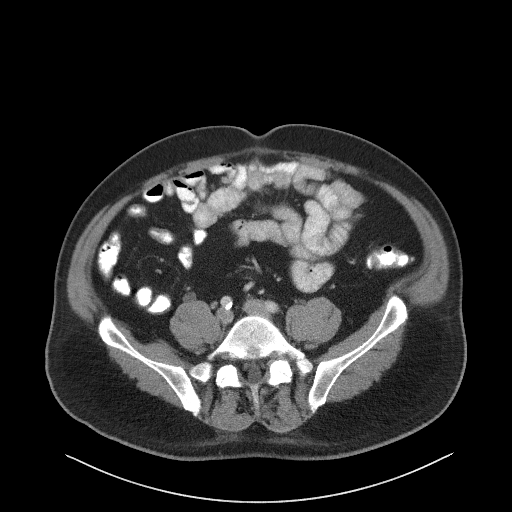
[im 49/104  soft-tissue]
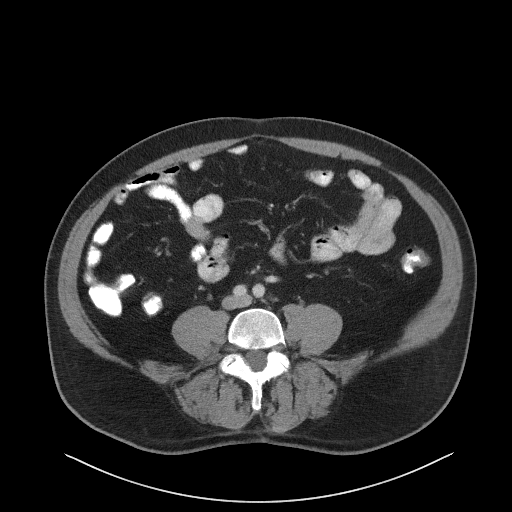
[im 55/104  soft-tissue]
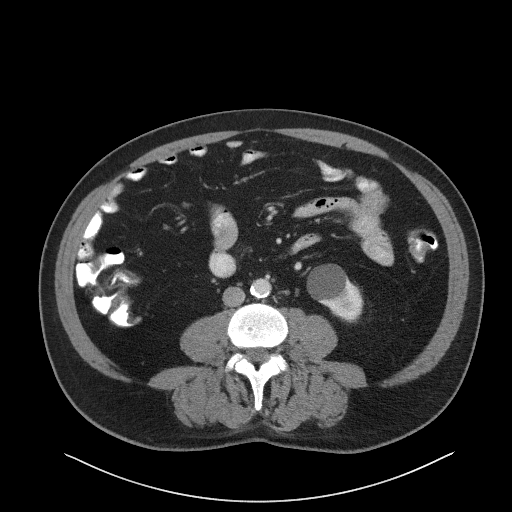
[im 67/104  soft-tissue]
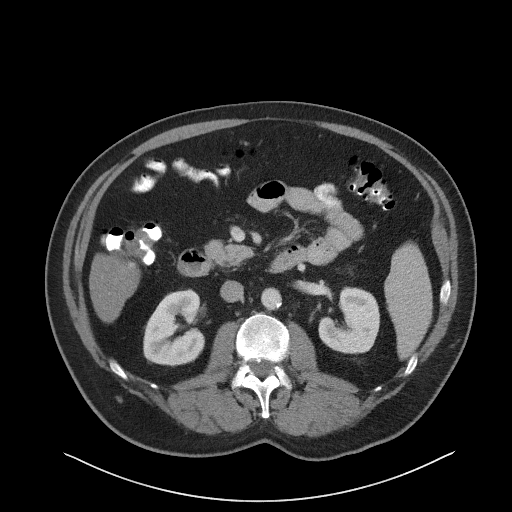
[im 73/104  soft-tissue]
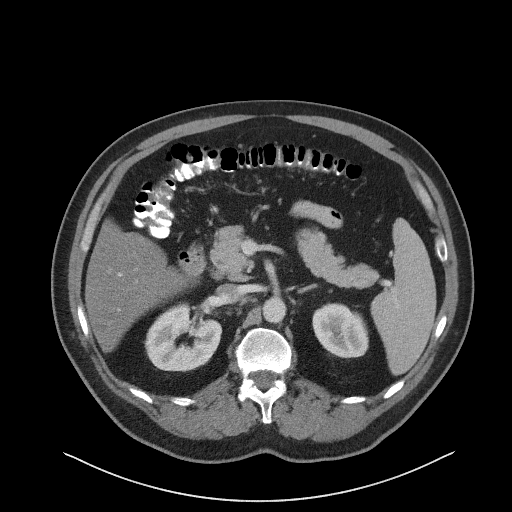
[im 73/104  bone]
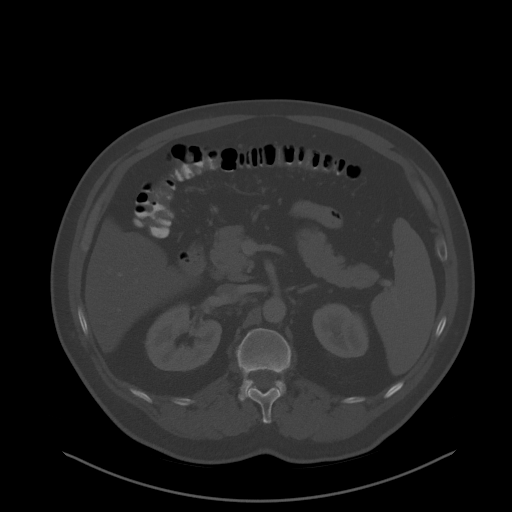
[im 79/104  soft-tissue]
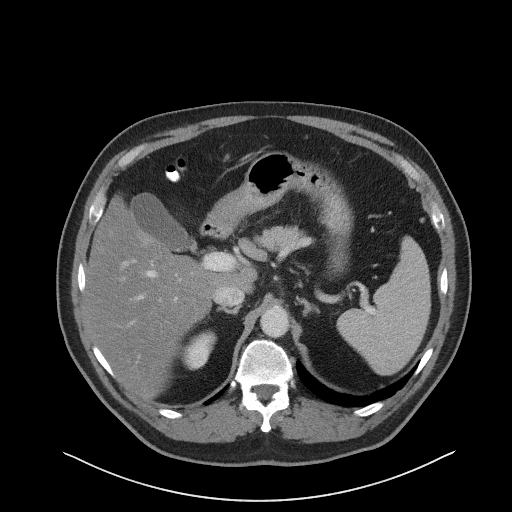
[im 91/104  soft-tissue]
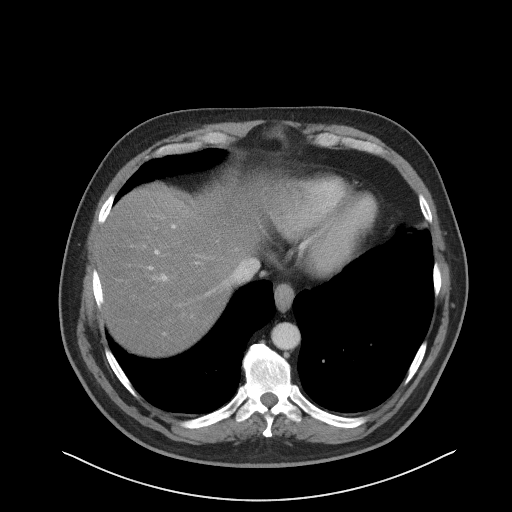
[im 97/104  soft-tissue]
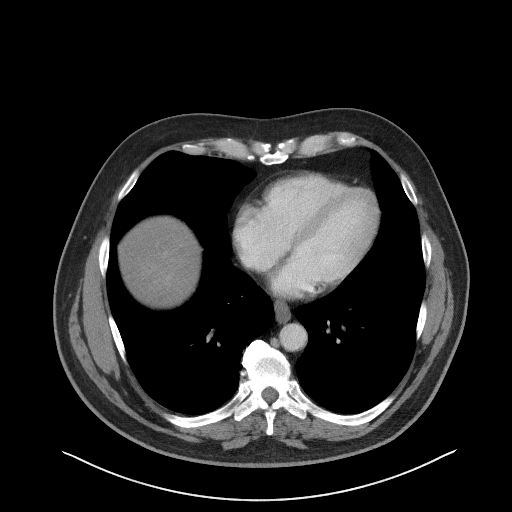

[Series 5: coronal st · coronal · 0.80mm/px · 3 of 113 slices shown]
[im 38/113  soft-tissue]
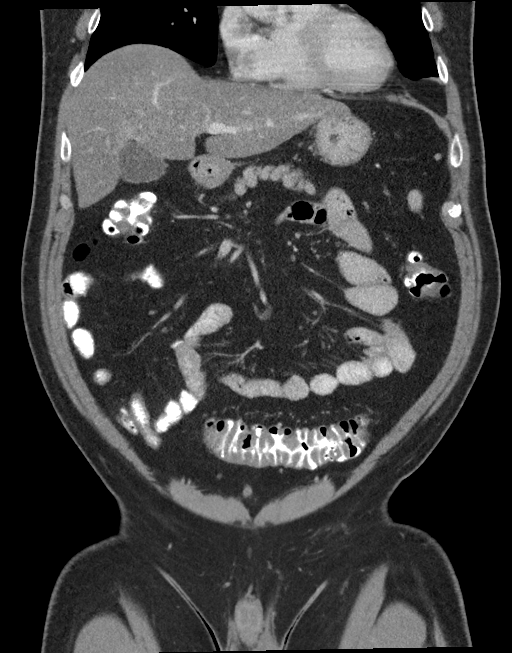
[im 50/113  soft-tissue]
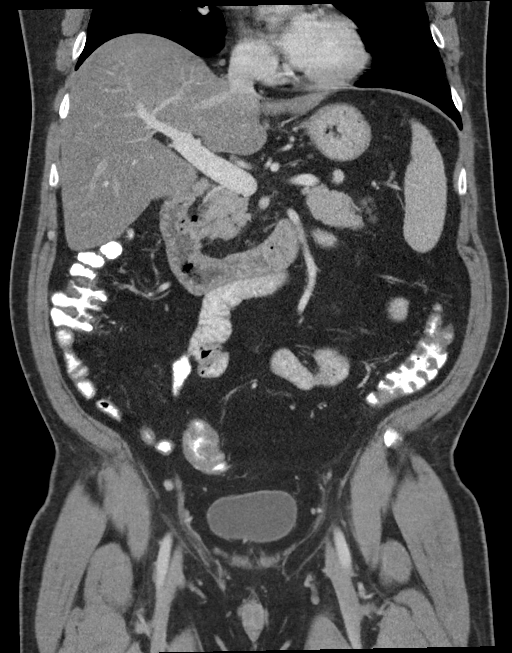
[im 63/113  soft-tissue]
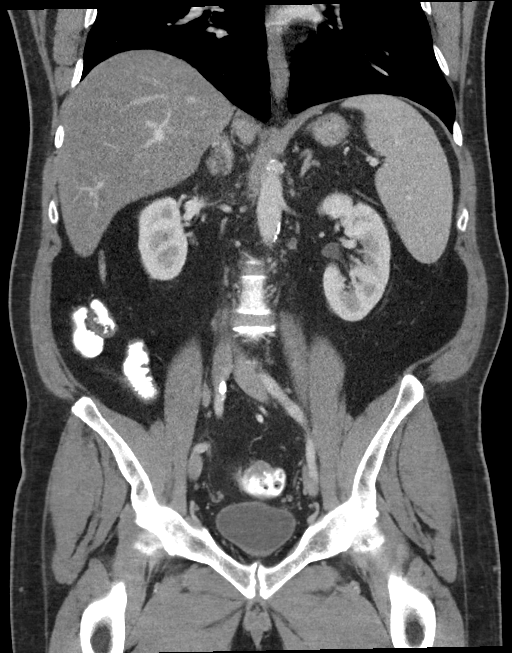

[15 of 46 positions shown; findings below may reference images not displayed]

FINDINGS: Lower chest: 2 small left lower lobe pulmonary nodules, series 4
images 6 and 7 are unchanged from prior exam. Tiny subpleural nodule
in the left lower lobe series 4, image 9, also unchanged. No acute
airspace disease or pleural fluid. Heart is normal in size.

Hepatobiliary: Diffusely decreased hepatic density consistent with
steatosis. Focal fatty sparing adjacent to the gallbladder fossa.
There is no discrete focal hepatic lesion. No capsular nodularity.
Gallstones without pericholecystic inflammation. There is no biliary
dilatation.

Pancreas: No ductal dilatation or inflammation.

Spleen: Mild splenomegaly with spleen measuring 14.2 x 13.7 x 5.8 cm
(volume = 590 cm^3). No focal abnormality. Slight increased spleen
size from prior exam.

Adrenals/Urinary Tract: Normal adrenal glands. No hydronephrosis or
perinephric edema. Homogeneous renal enhancement with symmetric
excretion on delayed phase imaging. 13 mm cyst in the lower right
kidney. 3.6 cm cyst in the lower left kidney. No evidence of
suspicious renal lesion. Urinary bladder is physiologically
distended without wall thickening.

Stomach/Bowel: Mild distal esophageal wall thickening. The stomach
is decompressed. There is a small periampullary duodenal
diverticulum without inflammation. Normal positioning of the
duodenum and ligament of Treitz. Small bowel appears normal without
obstruction, inflammation or wall thickening. Administered enteric
contrast is seen throughout the colon. The terminal ileum is normal.
Normal appendix courses into the right pericolic gutter. Occasional
colonic diverticula most prominent involving the sigmoid colon. No
diverticulitis or acute colonic inflammation. Small volume of
colonic stool.

Vascular/Lymphatic: Moderate aorto bi-iliac atherosclerosis for age.
No aortic aneurysm. Patent portal vein. Prominent periportal nodes
likely reactive, 13 mm short axis, series 2, image 30. There is also
a 12 mm peripancreatic node, series 2, image 26.

Reproductive: Prostate is unremarkable.

Other: No ascites. No free air or intra-abdominal abscess. Small fat
containing umbilical hernia. Fat in the left inguinal canal.

Musculoskeletal: Similar appearance of sclerotic lesion with
ground-glass matrix in the posterior right iliac bone spanning
approximately 2.9 cm. Mild degenerative change in the spine.
IMPRESSION: 1. Mild distal esophageal wall thickening, can be seen with reflux
or esophagitis.
2. Hepatic steatosis. No focal hepatic lesion.
3. Mild splenomegaly, slightly increased from prior exam.
4. Cholelithiasis without gallbladder inflammation.
5. Colonic diverticulosis without diverticulitis.
6. Prominent periportal and peripancreatic nodes are likely
reactive.
7. Stable sclerotic lesion in the posterior right iliac bone with
ground-glass matrix, suggesting fibrous dysplasia.
8. Small left lower lobe pulmonary nodules, unchanged from 4192 exam
and considered benign.

Aortic Atherosclerosis ([id number]-EZ8.8).

## 2021-07-18 ENCOUNTER — Other Ambulatory Visit (HOSPITAL_COMMUNITY): Payer: Self-pay | Admitting: Internal Medicine

## 2021-07-18 ENCOUNTER — Other Ambulatory Visit: Payer: Self-pay | Admitting: Internal Medicine

## 2021-07-18 ENCOUNTER — Other Ambulatory Visit: Payer: Self-pay

## 2021-07-18 ENCOUNTER — Ambulatory Visit
Admission: RE | Admit: 2021-07-18 | Discharge: 2021-07-18 | Disposition: A | Discharge status: Home / Self Care | Payer: BC Managed Care – PPO | Source: Ambulatory Visit | Attending: Internal Medicine | Admitting: Internal Medicine

## 2021-07-18 DIAGNOSIS — R1011 Right upper quadrant pain: Secondary | ICD-10-CM | POA: Insufficient documentation

## 2021-07-18 DIAGNOSIS — R1084 Generalized abdominal pain: Secondary | ICD-10-CM

## 2021-07-18 LAB — POCT I-STAT CREATININE: Creatinine, Ser: 1.1 mg/dL (ref 0.61–1.24)

## 2021-07-18 MED ORDER — IOHEXOL 350 MG/ML SOLN
100.0000 mL | Freq: Once | INTRAVENOUS | Status: AC | PRN
  Administered 2021-07-18: 100 mL via INTRAVENOUS

## 2022-03-10 IMAGING — CT CT ABD-PELV W/ CM
2 of 5 series · 16 of 46 positions shown, 18 images · IV contrast (APPLIED)
Comparison: 05/18/2020

CLINICAL DATA: Generalized abdominal pain and right upper quadrant
pain.

EXAM:
CT ABDOMEN AND PELVIS WITH CONTRAST
TECHNIQUE: Multidetector CT imaging of the abdomen and pelvis was performed
using the standard protocol following bolus administration of
intravenous contrast.
CONTRAST:  100mL OMNIPAQUE IOHEXOL 350 MG/ML SOLN

[Series 2: axial st · axial · 0.82mm/px · z∈[-829,-369]mm · 13 of 106 slices shown, 15 images]
[im 7/106  soft-tissue]
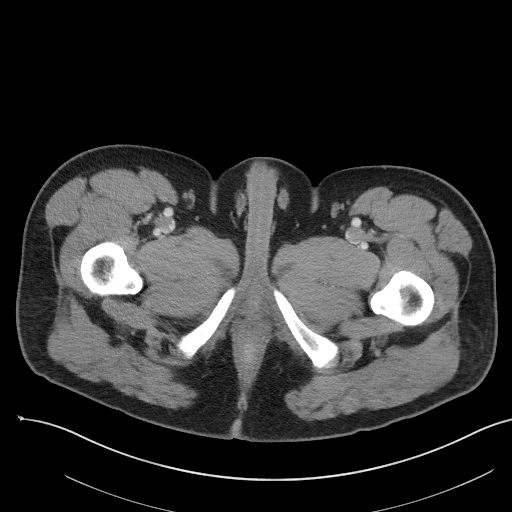
[im 7/106  bone]
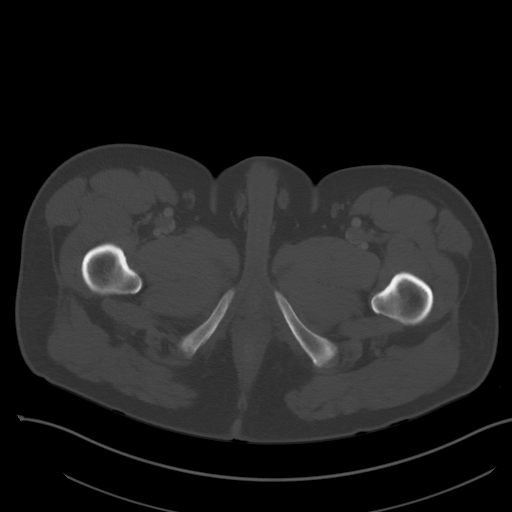
[im 14/106  soft-tissue]
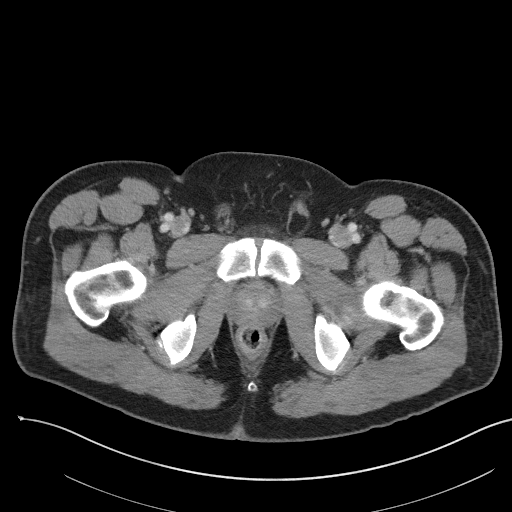
[im 20/106  soft-tissue]
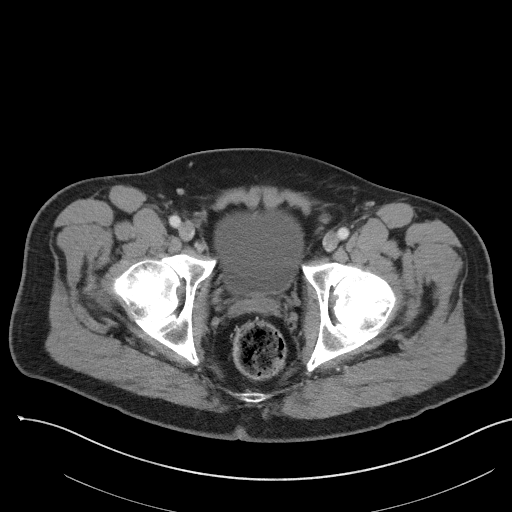
[im 33/106  soft-tissue]
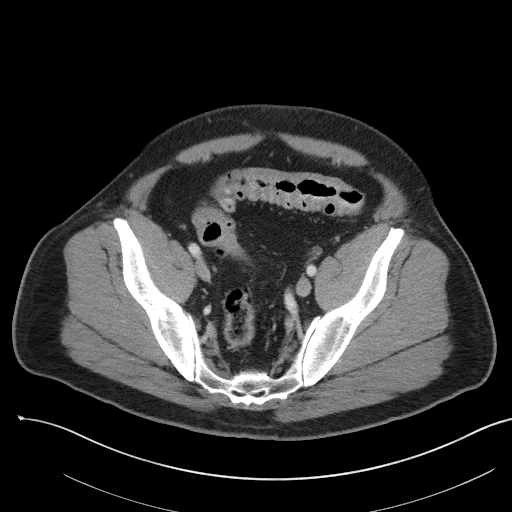
[im 40/106  soft-tissue]
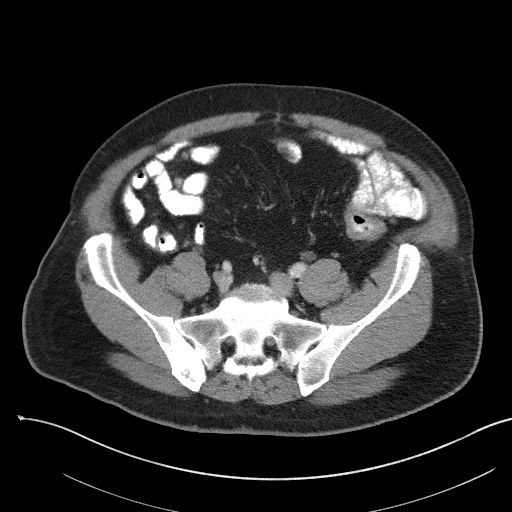
[im 46/106  soft-tissue]
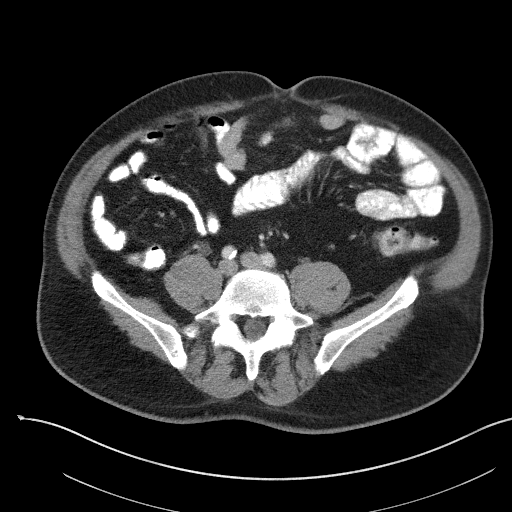
[im 53/106  soft-tissue]
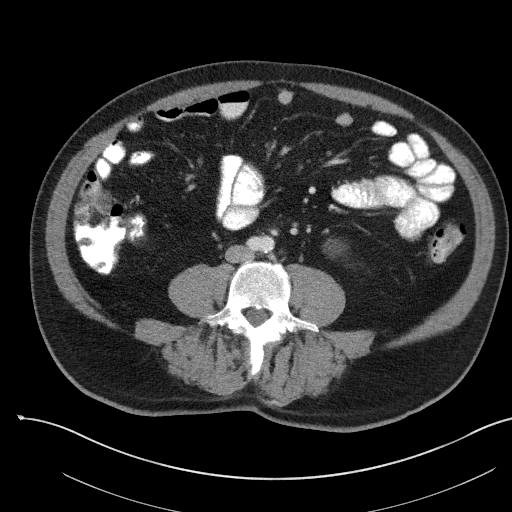
[im 60/106  soft-tissue]
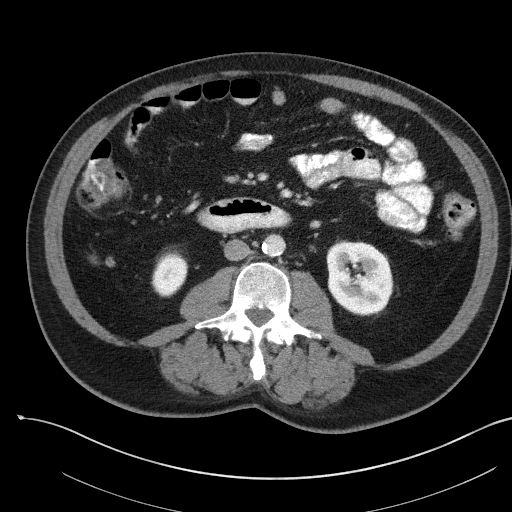
[im 66/106  soft-tissue]
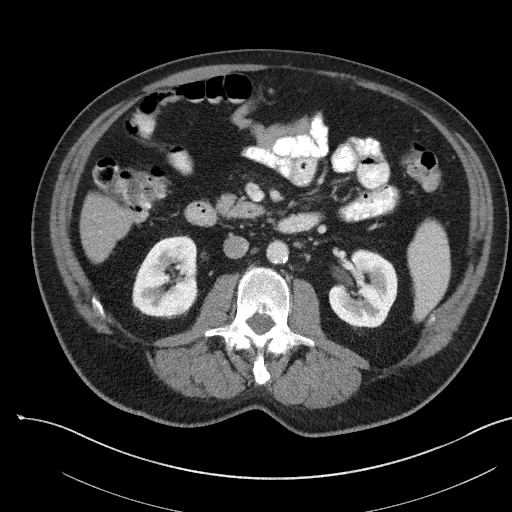
[im 66/106  bone]
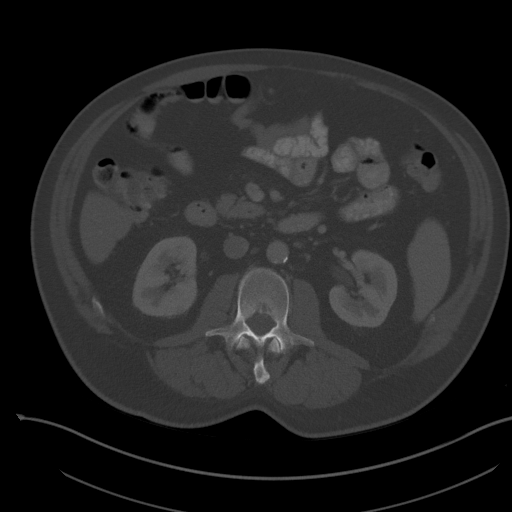
[im 73/106  soft-tissue]
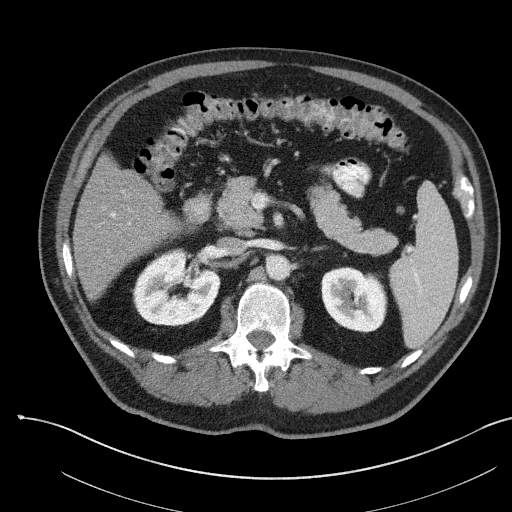
[im 86/106  soft-tissue]
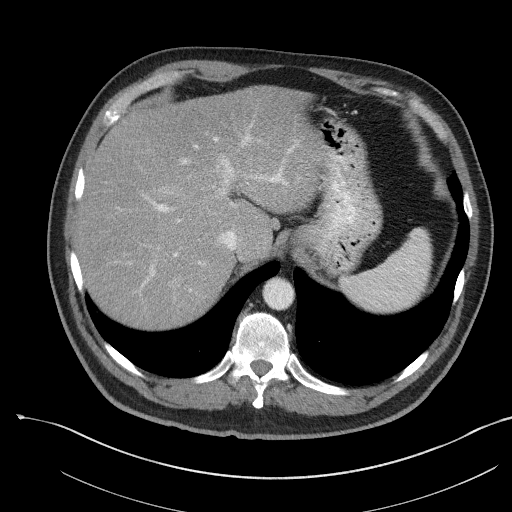
[im 92/106  soft-tissue]
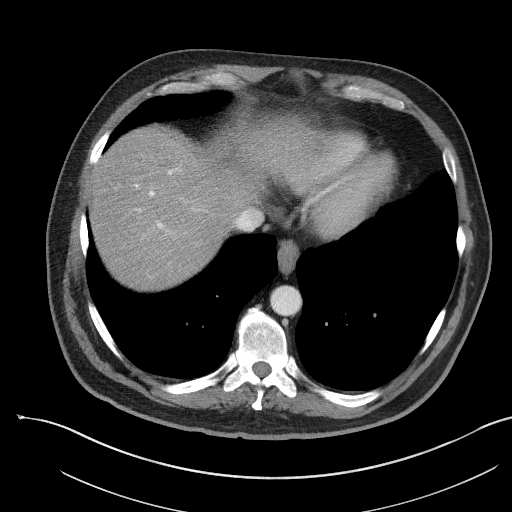
[im 99/106  soft-tissue]
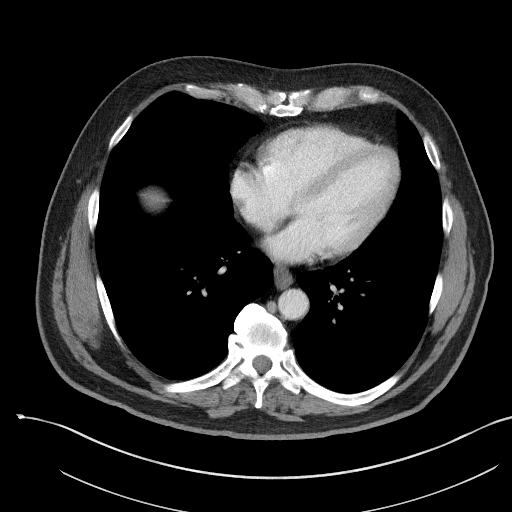

[Series 5: coronal st · coronal · 0.84mm/px · 3 of 106 slices shown]
[im 36/106  soft-tissue]
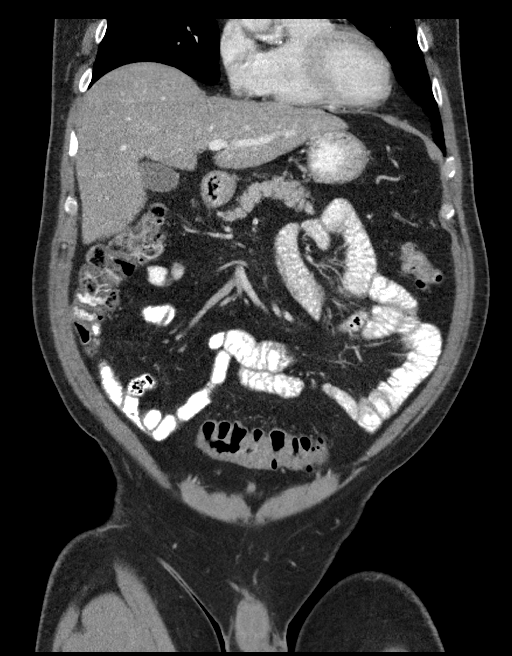
[im 47/106  soft-tissue]
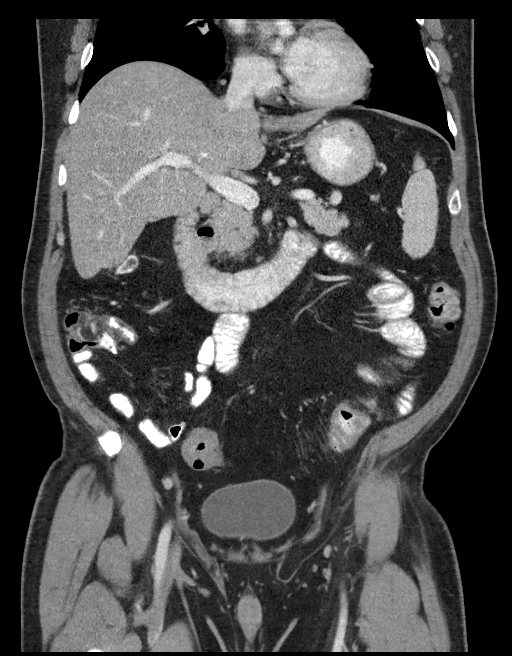
[im 59/106  soft-tissue]
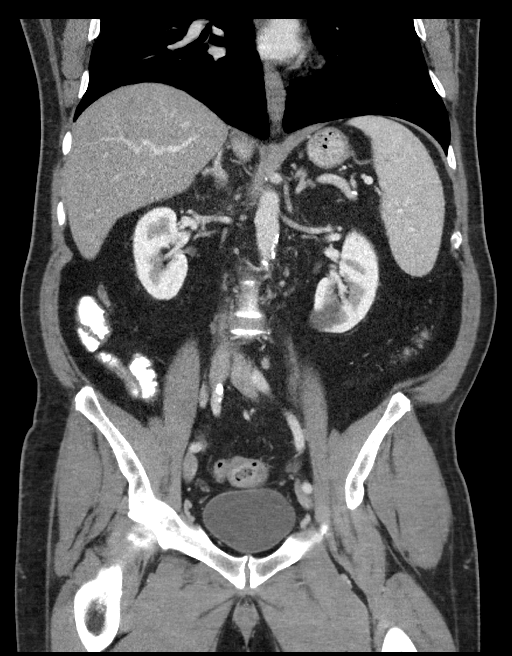

[16 of 46 positions shown; findings below may reference images not displayed]

FINDINGS: Lower Chest: No acute findings.

Hepatobiliary: No hepatic masses identified. Moderate diffuse
hepatic steatosis is again seen. A small less than 1 cm calcified
gallstone is again seen, however there is no evidence of
cholecystitis or biliary ductal dilatation.

Pancreas:  No mass or inflammatory changes.

Spleen: Within normal limits in size and appearance.

Adrenals/Urinary Tract: No masses identified. Small benign-appearing
renal cysts are again seen bilaterally, largest in the lower pole of
the left kidney measuring 3.6 cm. No evidence of ureteral calculi or
hydronephrosis.

Stomach/Bowel: No evidence of obstruction, inflammatory process or
abnormal fluid collections. Normal appendix visualized.
Diverticulosis is seen mainly involving the sigmoid colon, however
there is no evidence of diverticulitis.

Vascular/Lymphatic: No pathologically enlarged lymph nodes. No acute
vascular findings.

Reproductive:  No mass or other significant abnormality.

Other:  None.

Musculoskeletal: No suspicious bone lesions identified. Stable
sclerotic lesion in the right ilium, likely due to fibrous
dysplasia.
IMPRESSION: No acute findings within the abdomen or pelvis.

Stable moderate hepatic steatosis.

Cholelithiasis. No radiographic evidence of cholecystitis.

Colonic diverticulosis. No radiographic evidence of diverticulitis.

## 2023-11-12 ENCOUNTER — Other Ambulatory Visit
Admission: RE | Admit: 2023-11-12 | Discharge: 2023-11-12 | Disposition: A | Discharge status: Home / Self Care | Payer: BC Managed Care – PPO | Source: Ambulatory Visit | Attending: Surgery | Admitting: Surgery

## 2023-11-12 DIAGNOSIS — L02214 Cutaneous abscess of groin: Secondary | ICD-10-CM | POA: Insufficient documentation

## 2023-11-18 LAB — AEROBIC/ANAEROBIC CULTURE W GRAM STAIN (SURGICAL/DEEP WOUND): Gram Stain: NONE SEEN
